# Patient Record
Sex: Female | Born: 1975 | Race: White | Hispanic: No | Marital: Married | Smoking: Former smoker
Health system: Southern US, Community
[De-identification: ages and names within clinical notes are randomized; demographics above are authoritative.]

## PROBLEM LIST (undated history)

## (undated) DIAGNOSIS — I10 Essential (primary) hypertension: Secondary | ICD-10-CM

## (undated) DIAGNOSIS — F419 Anxiety disorder, unspecified: Secondary | ICD-10-CM

## (undated) DIAGNOSIS — R519 Headache, unspecified: Secondary | ICD-10-CM

## (undated) DIAGNOSIS — F329 Major depressive disorder, single episode, unspecified: Secondary | ICD-10-CM

## (undated) DIAGNOSIS — F32A Depression, unspecified: Secondary | ICD-10-CM

## (undated) DIAGNOSIS — I639 Cerebral infarction, unspecified: Secondary | ICD-10-CM

## (undated) DIAGNOSIS — R51 Headache: Secondary | ICD-10-CM

## (undated) HISTORY — DX: Depression, unspecified: F32.A

## (undated) HISTORY — DX: Headache: R51

## (undated) HISTORY — PX: NO PAST SURGERIES: SHX2092

## (undated) HISTORY — DX: Headache, unspecified: R51.9

## (undated) HISTORY — DX: Cerebral infarction, unspecified: I63.9

## (undated) HISTORY — DX: Anxiety disorder, unspecified: F41.9

## (undated) HISTORY — DX: Major depressive disorder, single episode, unspecified: F32.9

## (undated) HISTORY — PX: ESSURE TUBAL LIGATION: SUR464

---

## 2013-03-14 LAB — HM PAP SMEAR

## 2013-06-11 ENCOUNTER — Ambulatory Visit: Payer: Self-pay | Admitting: Family Medicine

## 2013-06-25 ENCOUNTER — Encounter: Payer: Self-pay | Admitting: Family Medicine

## 2013-06-25 ENCOUNTER — Ambulatory Visit (INDEPENDENT_AMBULATORY_CARE_PROVIDER_SITE_OTHER): Payer: BC Managed Care – PPO | Admitting: Family Medicine

## 2013-06-25 ENCOUNTER — Encounter (INDEPENDENT_AMBULATORY_CARE_PROVIDER_SITE_OTHER): Payer: Self-pay

## 2013-06-25 VITALS — BP 116/81 | HR 69 | Resp 16 | Ht 66.5 in | Wt 249.0 lb

## 2013-06-25 DIAGNOSIS — E669 Obesity, unspecified: Secondary | ICD-10-CM

## 2013-06-25 DIAGNOSIS — I10 Essential (primary) hypertension: Secondary | ICD-10-CM

## 2013-06-25 MED ORDER — LISINOPRIL-HYDROCHLOROTHIAZIDE 10-12.5 MG PO TABS: 1.0000 | ORAL_TABLET | Freq: Every day | ORAL | Status: DC

## 2013-06-25 NOTE — Progress Notes (Signed)
Subjective:    Patient ID: Virginia Grant, female    DOB: [birth year], 38 y.o.   MRN: [id number]  HPI  Virginia Grant is here today to establish care with our practice.  She found our name online.  In the past, she has received her care at [facility]) whenever she got sick and has not had a PCP in a long time.  She has received GYN care at [facility] by Dr Arnette Schaumann.  She would like to discuss the condition listed below:   1)  Obesity - She has struggled with weight fluctuation for several years.  She has tried diet and exercise alone and can not seem to get and keep the weight off.  She wants to discuss appetitive suppressant options that will not interfere with her mood medications.    Review of Systems  Constitutional: Positive for unexpected weight change. Negative for activity change and fatigue.  HENT: Negative.   Eyes: Negative.   Respiratory: Negative for shortness of breath.   Cardiovascular: Negative for chest pain, palpitations and leg swelling.  Gastrointestinal: Negative for diarrhea and constipation.  Endocrine: Negative.   Genitourinary: Negative for difficulty urinating.  Musculoskeletal: Negative.   Skin: Negative.   Neurological: Negative.   Hematological: Negative for adenopathy. Does not bruise/bleed easily.  Psychiatric/Behavioral: Negative for sleep disturbance and dysphoric mood. The patient is not nervous/anxious.      Past Medical History  Diagnosis Date  . Depression   . Hyperlipidemia   . Anxiety      Past Surgical History  Procedure Laterality Date  . Essure tubal ligation       History   Social History Narrative   Marital Status:  Married Museum/gallery curator)    Children:  Son [name])   Pets: Dogs (3)    Living Situation: Lives with husband and son.   Occupation:  Counsellor Needs]  [employer]   Education:  Bachelor's Degree    Tobacco Use:  She smoked 1 ppd for about 5 years.  She quit in 1997.     Alcohol Use:  Rarely   Drug Use:  None   Diet:  Regular   Exercise:  Limited; She was exercising up until December but has gotten off track. She was walking/running and was going to the [facility].     Hobbies:  Family                  Family History  Problem Relation Age of Onset  . Hypertension Mother   . Diabetes Father   . Hypertension Father   . Cancer Maternal Aunt     Colon  . Cancer Maternal Uncle     Colon   . Heart attack Maternal Uncle   . COPD Maternal Grandmother   . Heart disease Maternal Grandmother      No Known Allergies   Immunization History  Administered Date(s) Administered  . Tdap 04/25/1998      Objective:   Physical Exam  Vitals reviewed. Constitutional: She is oriented to person, place, and time.  Eyes: Conjunctivae are normal. No scleral icterus.  Neck: Neck supple. No thyromegaly present.  Cardiovascular: Normal rate, regular rhythm and normal heart sounds.   Pulmonary/Chest: Effort normal and breath sounds normal.  Musculoskeletal: She exhibits no edema and no tenderness.  Lymphadenopathy:    She has no cervical adenopathy.  Neurological: She is alert and oriented to person, place, and time.  Skin: Skin is warm and dry.  Psychiatric: She has a normal mood and affect. Her behavior is normal. Judgment and thought content normal.      Assessment & Plan:    Virginia Grant was seen today for establish care.  Diagnoses and associated orders for this visit:  Essential hypertension, benign - lisinopril-hydrochlorothiazide (PRINZIDE,ZESTORETIC) 10-12.5 MG per tablet; Take 1 tablet by mouth daily.   TIME SPENT "FACE TO FACE" WITH PATIENT -  30 MINS

## 2013-06-25 NOTE — Patient Instructions (Signed)
1)  Labs - CBC, CMET, TSH and Lipid Panel  2)  Read "Pounds & Inches"    Exercise to Lose Weight Exercise and a healthy diet may help you lose weight. Your doctor may suggest specific exercises. EXERCISE IDEAS AND TIPS  Choose low-cost things you enjoy doing, such as walking, bicycling, or exercising to workout videos.  Take stairs instead of the elevator.  Walk during your lunch break.  Park your car further away from work or school.  Go to a gym or an exercise class.  Start with 5 to 10 minutes of exercise each day. Build up to 30 minutes of exercise 4 to 6 days a week.  Wear shoes with good support and comfortable clothes.  Stretch before and after working out.  Work out until you breathe harder and your heart beats faster.  Drink extra water when you exercise.  Do not do so much that you hurt yourself, feel dizzy, or get very short of breath. Exercises that burn about 150 calories:  Running 1  miles in 15 minutes.  Playing volleyball for 45 to 60 minutes.  Washing and waxing a car for 45 to 60 minutes.  Playing touch football for 45 minutes.  Walking 1  miles in 35 minutes.  Pushing a stroller 1  miles in 30 minutes.  Playing basketball for 30 minutes.  Raking leaves for 30 minutes.  Bicycling 5 miles in 30 minutes.  Walking 2 miles in 30 minutes.  Dancing for 30 minutes.  Shoveling snow for 15 minutes.  Swimming laps for 20 minutes.  Walking up stairs for 15 minutes.  Bicycling 4 miles in 15 minutes.  Gardening for 30 to 45 minutes.  Jumping rope for 15 minutes.  Washing windows or floors for 45 to 60 minutes. Document Released: 05/14/2010 Document Revised: 07/04/2011 Document Reviewed: 05/14/2010 [facility] Patient Information 2014 [place], [place].

## 2013-07-14 DIAGNOSIS — E669 Obesity, unspecified: Secondary | ICD-10-CM

## 2013-07-14 DIAGNOSIS — I10 Essential (primary) hypertension: Secondary | ICD-10-CM | POA: Insufficient documentation

## 2013-07-14 HISTORY — DX: Obesity, unspecified: E66.9

## 2013-07-15 ENCOUNTER — Encounter: Payer: Self-pay | Admitting: Family Medicine

## 2013-07-15 ENCOUNTER — Ambulatory Visit (INDEPENDENT_AMBULATORY_CARE_PROVIDER_SITE_OTHER): Payer: BC Managed Care – PPO | Admitting: Family Medicine

## 2013-07-15 VITALS — BP 126/82 | HR 78 | Resp 16 | Ht 66.5 in | Wt 245.0 lb

## 2013-07-15 DIAGNOSIS — Z23 Encounter for immunization: Secondary | ICD-10-CM

## 2013-07-15 DIAGNOSIS — Z0000 Encounter for general adult medical examination without abnormal findings: Secondary | ICD-10-CM

## 2013-07-15 DIAGNOSIS — R5381 Other malaise: Secondary | ICD-10-CM

## 2013-07-15 DIAGNOSIS — R5383 Other fatigue: Secondary | ICD-10-CM

## 2013-07-15 LAB — POCT URINALYSIS DIPSTICK
Bilirubin, UA: NEGATIVE
Blood, UA: NEGATIVE
Glucose, UA: NEGATIVE
Ketones, UA: NEGATIVE
Leukocytes, UA: NEGATIVE
Nitrite, UA: NEGATIVE
Protein, UA: NEGATIVE
Spec Grav, UA: <=1.005
Urobilinogen, UA: NEGATIVE
pH, UA: 6.5

## 2013-07-15 MED ORDER — CYANOCOBALAMIN 1000 MCG/ML IJ SOLN
1000.0000 ug | Freq: Once | INTRAMUSCULAR | Status: AC
Start: 2013-07-15 — End: 2013-07-15
  Administered 2013-07-15: 1000 ug via INTRAMUSCULAR

## 2013-07-15 NOTE — Progress Notes (Signed)
Subjective:    Patient ID: Virginia Grant, female    DOB: 1975/12/03, 38 y.o.   MRN: [id number]  HPI  Dois DavenportSandra is here today for her annual CPE.  She brought in her lab results to review.  Overall, she feels that her health is good.  Her only concern is her weight.  She has tried several weight loss programs with no success.  She would like to start HCG.    Review of Systems  Constitutional: Negative for activity change, appetite change, fatigue and unexpected weight change.  HENT: Negative for congestion, dental problem, ear pain, hearing loss, trouble swallowing and voice change.   Eyes: Negative for pain, redness and visual disturbance.  Respiratory: Negative for cough and shortness of breath.   Cardiovascular: Negative for chest pain, palpitations and leg swelling.  Gastrointestinal: Negative for nausea, vomiting, abdominal pain, diarrhea, constipation and blood in stool.  Endocrine: Negative for cold intolerance, heat intolerance, polydipsia, polyphagia and polyuria.  Genitourinary: Negative for dysuria, urgency, frequency, hematuria, vaginal discharge and pelvic pain.  Musculoskeletal: Negative for arthralgias, back pain, joint swelling, myalgias and neck pain.  Skin: Negative for rash.  Neurological: Negative for dizziness, weakness and headaches.  Hematological: Negative for adenopathy. Does not bruise/bleed easily.  Psychiatric/Behavioral: Negative for sleep disturbance, dysphoric mood and decreased concentration. The patient is not nervous/anxious.   All other systems reviewed and are negative.    Past Medical History  Diagnosis Date  . Depression   . Hyperlipidemia   . Anxiety      Past Surgical History  Procedure Laterality Date  . Essure tubal ligation       History   Social History Narrative   Marital Status:  Married Museum/gallery curator(Chris)    Children:  Son [name](Noah)   Pets: Dogs (3)    Living Situation: Lives with husband and son.   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  She quit in 1997.     Alcohol Use:  Rarely   Drug Use:  None   Diet:  Regular   Exercise:  Limited; She was exercising up until December but has gotten off track. She was walking/running and [facility].     Hobbies:  Family                  Family History  Problem Relation Age of Onset  . Hypertension Mother   . Diabetes Father   . Hypertension Father   . Cancer Maternal Aunt     Colon  . Cancer Maternal Uncle     Colon   . Heart attack Maternal Uncle   . COPD Maternal Grandmother   . Heart disease Maternal Grandmother      Current Outpatient Prescriptions on File Prior to Visit  Medication Sig Dispense Refill  . lisinopril-hydrochlorothiazide (PRINZIDE,ZESTORETIC) 10-12.5 MG per tablet Take 1 tablet by mouth daily.  30 tablet  1  . LORazepam (ATIVAN) 1 MG tablet Take 1 mg by mouth 2 (two) times daily.       Marland Kitchen. venlafaxine XR (EFFEXOR-XR) 75 MG 24 hr capsule Take 225 mg by mouth daily with breakfast.        No current facility-administered medications on file prior to visit.     No Known Allergies   Immunization History  Administered Date(s) Administered  . Tdap 04/25/1998, 07/15/2013  Objective:   Physical Exam  Nursing note and vitals reviewed. Constitutional: She is oriented to person, place, and time. She appears well-developed and well-nourished. No distress.  HENT:  Head: Normocephalic and atraumatic.  Right Ear: External ear normal.  Left Ear: External ear normal.  Nose: Nose normal.  Mouth/Throat: Oropharynx is clear and moist.  Eyes: Conjunctivae and EOM are normal. Pupils are equal, round, and reactive to light. Right eye exhibits no discharge. Left eye exhibits no discharge. No scleral icterus.  Neck: Normal range of motion. Neck supple. No thyromegaly present.  Cardiovascular: Normal rate, regular rhythm, normal heart  sounds and intact distal pulses.  Exam reveals no gallop and no friction rub.   No murmur heard. Pulmonary/Chest: Effort normal and breath sounds normal. Right breast exhibits no inverted nipple, no mass, no nipple discharge, no skin change and no tenderness. Left breast exhibits no inverted nipple, no mass, no nipple discharge, no skin change and no tenderness. Breasts are symmetrical.  Abdominal: Soft. Bowel sounds are normal. She exhibits no distension and no mass. There is no tenderness.  Musculoskeletal: Normal range of motion. She exhibits no edema and no tenderness.  Lymphadenopathy:    She has no cervical adenopathy.  Neurological: She is alert and oriented to person, place, and time. She has normal reflexes.  Skin: Skin is warm and dry. No rash noted.  Psychiatric: She has a normal mood and affect. Her behavior is normal. Judgment and thought content normal.      Assessment & Plan:    Mirna was seen today for annual exam.  Diagnoses and associated orders for this visit:  Routine general medical examination at a health care facility - POCT urinalysis dipstick - EKG 12-Lead  Other malaise and fatigue - cyanocobalamin ((VITAMIN B-12)) injection 1,000 mcg; Inject 1 mL (1,000 mcg total) into the muscle once.  Need for prophylactic vaccination with combined diphtheria-tetanus-pertussis (DTP) vaccine - Tdap vaccine greater than or equal to 7yo IM

## 2013-07-15 NOTE — Assessment & Plan Note (Signed)
She received her Boostrix without difficulty.  She was given a handout discussing possible side effects.  

## 2013-08-30 ENCOUNTER — Encounter: Payer: Self-pay | Admitting: Family Medicine

## 2013-08-30 ENCOUNTER — Ambulatory Visit (INDEPENDENT_AMBULATORY_CARE_PROVIDER_SITE_OTHER): Payer: BC Managed Care – PPO | Admitting: Family Medicine

## 2013-08-30 VITALS — BP 129/87 | HR 87 | Resp 16 | Ht 67.0 in | Wt 225.0 lb

## 2013-08-30 DIAGNOSIS — E669 Obesity, unspecified: Secondary | ICD-10-CM

## 2013-08-30 NOTE — Progress Notes (Signed)
Subjective:    Patient ID: Virginia AbrahamsSandra Grant, female    DOB: [birth year], 38 y.o.   MRN: [id number]  HPI  Virginia DavenportSandra is here for a follow up of her weight loss.  She has done well on the HCG program.  She has lost 20 lbs and 2.5 inches from around her abdomen.  She is very happy with her results.      Review of Systems  Constitutional: Negative for fatigue and unexpected weight change (patient dieting and exercising).  Cardiovascular: Negative for chest pain, palpitations and leg swelling.  Psychiatric/Behavioral: The patient is not nervous/anxious.   All other systems reviewed and are negative.   Past Medical History  Diagnosis Date  . Depression   . Hyperlipidemia   . Anxiety      Past Surgical History  Procedure Laterality Date  . Essure tubal ligation       History   Social History Narrative   Marital Status:  Married Museum/gallery curator(Chris)    Children:  Son [name](Noah)   Pets: Dogs (3)    Living Situation: Lives with husband and son.   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  She quit in 1997.     Alcohol Use:  Rarely   Drug Use:  None   Diet:  Regular   Exercise:  Limited; She was exercising up until December but has gotten off track. She was walking/running and was [facility].     Hobbies:  Family                  Family History  Problem Relation Age of Onset  . Hypertension Mother   . Diabetes Father   . Hypertension Father   . Cancer Maternal Aunt     Colon  . Cancer Maternal Uncle     Colon   . Heart attack Maternal Uncle   . COPD Maternal Grandmother   . Heart disease Maternal Grandmother      Current Outpatient Prescriptions on File Prior to Visit  Medication Sig Dispense Refill  . lisinopril-hydrochlorothiazide (PRINZIDE,ZESTORETIC) 10-12.5 MG per tablet Take 1 tablet by mouth daily.  30 tablet  1  . LORazepam (ATIVAN) 1 MG tablet Take 1 mg by  mouth 2 (two) times daily.       Marland Kitchen. venlafaxine XR (EFFEXOR-XR) 75 MG 24 hr capsule Take 225 mg by mouth daily with breakfast.        No current facility-administered medications on file prior to visit.     No Known Allergies   Immunization History  Administered Date(s) Administered  . Tdap 04/25/1998, 07/15/2013       Objective:   Physical Exam  Nursing note and vitals reviewed. Constitutional: She is oriented to person, place, and time. She appears well-nourished.  Eyes: Conjunctivae are normal. No scleral icterus.  Neck: Neck supple. No thyromegaly present.  Cardiovascular: Normal rate, regular rhythm and normal heart sounds.   Pulmonary/Chest: Effort normal and breath sounds normal.  Musculoskeletal: She exhibits no edema and no tenderness.  Lymphadenopathy:    She has no cervical adenopathy.  Neurological: She is alert and oriented to person, place, and time.  Skin: Skin is warm and dry.  Psychiatric: She has a normal mood and affect. Her behavior is normal. Judgment and thought content normal.      Assessment & Plan:    Virginia DavenportSandra  was seen today for weight check.  Diagnoses and associated orders for this visit:  Obesity, unspecified

## 2016-04-08 DIAGNOSIS — R1031 Right lower quadrant pain: Secondary | ICD-10-CM | POA: Insufficient documentation

## 2016-12-24 DIAGNOSIS — I639 Cerebral infarction, unspecified: Secondary | ICD-10-CM

## 2016-12-24 HISTORY — DX: Cerebral infarction, unspecified: I63.9

## 2017-01-09 ENCOUNTER — Other Ambulatory Visit: Payer: Self-pay

## 2017-01-09 ENCOUNTER — Observation Stay (HOSPITAL_COMMUNITY): Payer: BC Managed Care – PPO

## 2017-01-09 ENCOUNTER — Encounter: Payer: Self-pay | Admitting: Family

## 2017-01-09 ENCOUNTER — Encounter (HOSPITAL_BASED_OUTPATIENT_CLINIC_OR_DEPARTMENT_OTHER): Payer: Self-pay

## 2017-01-09 ENCOUNTER — Inpatient Hospital Stay (HOSPITAL_BASED_OUTPATIENT_CLINIC_OR_DEPARTMENT_OTHER)
Admission: EM | Admit: 2017-01-09 | Discharge: 2017-01-11 | DRG: 065 | Disposition: A | Discharge status: Home / Self Care | Payer: BC Managed Care – PPO | Attending: Family Medicine | Admitting: Family Medicine

## 2017-01-09 ENCOUNTER — Ambulatory Visit (HOSPITAL_BASED_OUTPATIENT_CLINIC_OR_DEPARTMENT_OTHER)
Admission: RE | Admit: 2017-01-09 | Discharge: 2017-01-09 | Disposition: A | Discharge status: Home / Self Care | Payer: BC Managed Care – PPO | Source: Ambulatory Visit | Attending: Family | Admitting: Family

## 2017-01-09 ENCOUNTER — Ambulatory Visit (INDEPENDENT_AMBULATORY_CARE_PROVIDER_SITE_OTHER): Payer: BC Managed Care – PPO | Admitting: Family

## 2017-01-09 ENCOUNTER — Telehealth: Payer: Self-pay | Admitting: Family Medicine

## 2017-01-09 VITALS — BP 123/63 | HR 64 | Temp 98.6°F | Resp 16 | Ht 66.5 in | Wt 216.0 lb

## 2017-01-09 DIAGNOSIS — F329 Major depressive disorder, single episode, unspecified: Secondary | ICD-10-CM | POA: Insufficient documentation

## 2017-01-09 DIAGNOSIS — F418 Other specified anxiety disorders: Secondary | ICD-10-CM | POA: Diagnosis not present

## 2017-01-09 DIAGNOSIS — R471 Dysarthria and anarthria: Secondary | ICD-10-CM | POA: Diagnosis present

## 2017-01-09 DIAGNOSIS — Z6833 Body mass index (BMI) 33.0-33.9, adult: Secondary | ICD-10-CM

## 2017-01-09 DIAGNOSIS — R531 Weakness: Secondary | ICD-10-CM

## 2017-01-09 DIAGNOSIS — I639 Cerebral infarction, unspecified: Principal | ICD-10-CM | POA: Diagnosis present

## 2017-01-09 DIAGNOSIS — R29701 NIHSS score 1: Secondary | ICD-10-CM | POA: Diagnosis present

## 2017-01-09 DIAGNOSIS — F411 Generalized anxiety disorder: Secondary | ICD-10-CM | POA: Insufficient documentation

## 2017-01-09 DIAGNOSIS — R4781 Slurred speech: Secondary | ICD-10-CM | POA: Diagnosis present

## 2017-01-09 DIAGNOSIS — I6330 Cerebral infarction due to thrombosis of unspecified cerebral artery: Secondary | ICD-10-CM

## 2017-01-09 DIAGNOSIS — Z823 Family history of stroke: Secondary | ICD-10-CM

## 2017-01-09 DIAGNOSIS — Z87891 Personal history of nicotine dependence: Secondary | ICD-10-CM

## 2017-01-09 DIAGNOSIS — R2990 Unspecified symptoms and signs involving the nervous system: Secondary | ICD-10-CM | POA: Insufficient documentation

## 2017-01-09 DIAGNOSIS — R29818 Other symptoms and signs involving the nervous system: Secondary | ICD-10-CM

## 2017-01-09 DIAGNOSIS — E669 Obesity, unspecified: Secondary | ICD-10-CM | POA: Diagnosis present

## 2017-01-09 DIAGNOSIS — R29810 Facial weakness: Secondary | ICD-10-CM | POA: Diagnosis present

## 2017-01-09 DIAGNOSIS — I10 Essential (primary) hypertension: Secondary | ICD-10-CM | POA: Diagnosis not present

## 2017-01-09 DIAGNOSIS — Z800 Family history of malignant neoplasm of digestive organs: Secondary | ICD-10-CM

## 2017-01-09 DIAGNOSIS — Z8249 Family history of ischemic heart disease and other diseases of the circulatory system: Secondary | ICD-10-CM

## 2017-01-09 DIAGNOSIS — G8194 Hemiplegia, unspecified affecting left nondominant side: Secondary | ICD-10-CM | POA: Diagnosis present

## 2017-01-09 HISTORY — DX: Essential (primary) hypertension: I10

## 2017-01-09 LAB — DIFFERENTIAL
BASOS ABS: 0 10*3/uL (ref 0.0–0.1)
BASOS PCT: 0 %
EOS ABS: 0.2 10*3/uL (ref 0.0–0.7)
Eosinophils Relative: 3 %
LYMPHS ABS: 1.5 10*3/uL (ref 0.7–4.0)
Lymphocytes Relative: 21 %
MONO ABS: 0.4 10*3/uL (ref 0.1–1.0)
MONOS PCT: 5 %
NEUTROS ABS: 5 10*3/uL (ref 1.7–7.7)
Neutrophils Relative %: 71 %

## 2017-01-09 LAB — BASIC METABOLIC PANEL
Anion gap: 8 (ref 5–15)
BUN: 14 mg/dL (ref 6–20)
CALCIUM: 8.8 mg/dL — AB (ref 8.9–10.3)
CO2: 23 mmol/L (ref 22–32)
Chloride: 107 mmol/L (ref 101–111)
Creatinine, Ser: 0.85 mg/dL (ref 0.44–1.00)
GFR calc Af Amer: >60 mL/min (ref >60)
GLUCOSE: 111 mg/dL — AB (ref 65–99)
Potassium: 3.7 mmol/L (ref 3.5–5.1)
Sodium: 138 mmol/L (ref 135–145)

## 2017-01-09 LAB — PROTIME-INR
INR: 1
PROTHROMBIN TIME: 13.1 s (ref 11.4–15.2)

## 2017-01-09 LAB — APTT: aPTT: 27 seconds (ref 24–36)

## 2017-01-09 LAB — CBC
HCT: 38.6 % (ref 36.0–46.0)
HEMOGLOBIN: 13.3 g/dL (ref 12.0–15.0)
MCH: 32.1 pg (ref 26.0–34.0)
MCHC: 34.5 g/dL (ref 30.0–36.0)
MCV: 93.2 fL (ref 78.0–100.0)
Platelets: 251 10*3/uL (ref 150–400)
RBC: 4.14 MIL/uL (ref 3.87–5.11)
RDW: 13 % (ref 11.5–15.5)
WBC: 7.1 10*3/uL (ref 4.0–10.5)

## 2017-01-09 MED ORDER — ACETAMINOPHEN 160 MG/5ML PO SOLN: 650.0000 mg | ORAL | Status: DC | PRN

## 2017-01-09 MED ORDER — HYDRALAZINE HCL 20 MG/ML IJ SOLN: 5.0000 mg | Freq: Three times a day (TID) | INTRAMUSCULAR | Status: DC | PRN

## 2017-01-09 MED ORDER — STROKE: EARLY STAGES OF RECOVERY BOOK
Freq: Once | Status: AC
  Administered 2017-01-09: 18:00:00
  Filled 2017-01-09: qty 1

## 2017-01-09 MED ORDER — ACETAMINOPHEN 325 MG PO TABS: 650.0000 mg | ORAL_TABLET | ORAL | Status: DC | PRN

## 2017-01-09 MED ORDER — SODIUM CHLORIDE 0.9 % IV SOLN
INTRAVENOUS | Status: DC
  Administered 2017-01-09: 18:00:00 via INTRAVENOUS

## 2017-01-09 MED ORDER — VENLAFAXINE HCL ER 75 MG PO CP24: 225.0000 mg | ORAL_CAPSULE | Freq: Every day | ORAL | Status: DC

## 2017-01-09 MED ORDER — IOPAMIDOL (ISOVUE-370) INJECTION 76%
INTRAVENOUS | Status: AC
  Administered 2017-01-09: 50 mL
  Filled 2017-01-09: qty 50

## 2017-01-09 MED ORDER — SENNOSIDES-DOCUSATE SODIUM 8.6-50 MG PO TABS: 1.0000 | ORAL_TABLET | Freq: Every evening | ORAL | Status: DC | PRN

## 2017-01-09 MED ORDER — HEPARIN SODIUM (PORCINE) 5000 UNIT/ML IJ SOLN
5000.0000 [IU] | Freq: Three times a day (TID) | INTRAMUSCULAR | Status: DC
  Administered 2017-01-09 – 2017-01-11 (×7): 5000 [IU] via SUBCUTANEOUS
  Filled 2017-01-09 (×7): qty 1

## 2017-01-09 MED ORDER — ACETAMINOPHEN 650 MG RE SUPP: 650.0000 mg | RECTAL | Status: DC | PRN

## 2017-01-09 NOTE — Consult Note (Signed)
Neurology Consultation  Reason for Consult: abnormal imaging findings on CT Referring Physician: Dr. Konrad Dolores  CC: left facial droop, slurred speech, left-sided numbness and weakness  History is obtained from:patient  HPI: Virginia Grant is a 41 y.o. female Past medical history of anxiety and depression, who was in her usual state of health until Saturday, when she noticed some left lower facial drooping, and numbness in her left arm and leg. She attributed her symptoms to possibly having consumed a couple of alcoholic drinks and/or may be having Bell's palsy. She was out of town and was driving back with her husband. She came to the hospital today because she noticed that she is having difficulty lifting her left leg while negotiating the stairs. noncontrast CT of the head was done at [facility], that showed right internal capsular hypodensity.  She denies any prior similar episodes. Denies any episodes of blurred vision. She had headaches that were migrainous in nature till her late 43s. She is not on hormonal contraception. She has no history of miscarriages. No history of clots in the lungs or legs. She has 1 child. Her mother has rheumatoid arthritis. Her sister had one stillbirth and 1 miscarriage due to some clotting disorder for which she is on aspirin.  Patient denies any neck pain, whiplash-like injury to the neck, chiropractic manipulation. She denies any episodes of monocular visual loss or blurred vision, denies shocklike sensation down the spine while bending her neck or tingling or numbness while she has increased core body temperature such as during hot shower or while in a hot tub. No family history of multiple sclerosis.   LKW: Saturday 9 PM 01/07/2017 tpa given?: no, outside window Premorbid modified Rankin scale (mRS):0 ROS: A 14 point ROS was performed and is negative except as noted in the HPI.   Past Medical History:  Diagnosis Date  . Anxiety   .  Depression     Family History  Problem Relation Age of Onset  . Hypertension Mother   . Diabetes Father   . Hypertension Father   . Cancer Maternal Aunt        Colon  . Cancer Maternal Uncle        Colon   . Heart attack Maternal Uncle   . COPD Maternal Grandmother   . Heart disease Maternal Grandmother   . Hypertension Brother     Social History:   reports that she quit smoking about 21 years ago. Her smoking use included Cigarettes. She has a 5.00 pack-year smoking history. She has never used smokeless tobacco. She reports that she drinks alcohol. She reports that she does not use drugs.   Medications No current facility-administered medications for this encounter.   Home medications-Effexor XR  Exam: Current vital signs: BP (!) 142/77 (BP Location: Left Arm)   Pulse 82   Temp 98.4 F (36.9 C)   Resp 20   Ht  (1.702 m)   Wt 96.3 kg (212 lb 4.9 oz)   LMP 01/09/2017   SpO2 100%   BMI 33.25 kg/m  Vital signs in last 24 hours: Temp:  [98.4 F (36.9 C)-99.3 F (37.4 C)] 98.4 F (36.9 C) (09/17 1345) Pulse Rate:  [64-82] 82 (09/17 1203) Resp:  [16-20] 20 (09/17 1203) BP: (123-142)/(63-77) 142/77 (09/17 1203) SpO2:  [98 %-100 %] 100 % (09/17 1203) Weight:  [96.3 kg (212 lb 4.9 oz)-98 kg (216 lb)] 96.3 kg (212 lb 4.9 oz) (09/17 1202)  GENERAL: Awake, alert in  NAD HEENT: - Normocephalic and atraumatic, dry mm, no LN++, no Thyromegally LUNGS - Clear to auscultation bilaterally with no wheezes CV - S1S2 RRR, no m/r/g, equal pulses bilaterally. ABDOMEN - Soft, nontender, nondistended with normoactive BS Ext: warm, well perfused, intact peripheral pulses,  No edema  NEURO:  Mental Status: AA&Ox3  Language: speech is clear.  Naming, repetition, fluency, and comprehension intact. Cranial Nerves: PERRL 68mm/brisk. Funduscopy shows sharp disc margins,EOMI, visual fields full, left facial droop on the lower face, facial sensation intact, hearing intact,  tongue/uvula/soft palate midline, normal sternocleidomastoid and trapezius muscle strength. No evidence of tongue atrophy or fibrillations Motor: 5/5 right upper and right lower extremity. 5/5 left lower extremity. 4+/5 right lower extremity with no vertical drift. Tone: is normal and bulk is normal Sensation- Intact to light touch bilaterally Coordination: FTN intact bilaterally, no ataxia in BLE. Gait- deferred Deep tendon reflexes-brisk all over with downgoing toes  NIHSS 1a Level of Conscious.: 0 1b LOC Questions: 0 1c LOC Commands: 0 2 Best Gaze: 0 3 Visual: 0 4 Facial Palsy: 1 5a Motor Arm - left: 0 5b Motor Arm - Right: 0 6a Motor Leg - Left: 0 6b Motor Leg - Right: 0 7 Limb Ataxia: 0 8 Sensory: 0 9 Best Language: 0 10 Dysarthria: 0 11 Extinct. and Inatten.: 0 TOTAL: 1  Labs I have reviewed labs in epic and the results pertinent to this consultation are:  CBC    Component Value Date/Time   WBC 7.1 01/09/2017 1228   RBC 4.14 01/09/2017 1228   HGB 13.3 01/09/2017 1228   HCT 38.6 01/09/2017 1228   PLT 251 01/09/2017 1228   MCV 93.2 01/09/2017 1228   MCH 32.1 01/09/2017 1228   MCHC 34.5 01/09/2017 1228   RDW 13.0 01/09/2017 1228   LYMPHSABS 1.5 01/09/2017 1228   MONOABS 0.4 01/09/2017 1228   EOSABS 0.2 01/09/2017 1228   BASOSABS 0.0 01/09/2017 1228    CMP     Component Value Date/Time   NA 138 01/09/2017 1228   K 3.7 01/09/2017 1228   CL 107 01/09/2017 1228   CO2 23 01/09/2017 1228   GLUCOSE 111 (H) 01/09/2017 1228   BUN 14 01/09/2017 1228   CREATININE 0.85 01/09/2017 1228   CALCIUM 8.8 (L) 01/09/2017 1228   GFRNONAA >60 01/09/2017 1228   GFRAA >60 01/09/2017 1228    Lipid Panel  No results found for: CHOL, TRIG, HDL, CHOLHDL, VLDL, LDLCALC, LDLDIRECT   Imaging I have reviewed the images obtained:  CT-scan of the brain shows nonspecific looking hypodensity in the right insular and right internal capsule.  MRI examination of the brain -  pending  Assessment:  41 year old with a history of anxiety and depression presents for evaluation of acute onset left facial droop, dysarthria and left-sided weakness. On my examination, I was able to elicit mild left hemiparesis in the left upper extremity as well as left facial droop. CT scan imaging of the head shows a possible right cerebral hemisphere hypodensity, in the insular/internal capsular area.  Differentials to consider this time: -Stroke -Demyelinating disease -Mass  Recommendations: - MRI brain with and without contrast - I would also like to obtain a CTA of the head and neck - A1c, Lipid panel - ASA 325 for now. - Tele - 2D echo - Further recs after imaging results.  -- Milon Dikes, MD [facility] [phone]  If 7pm to 7am, please call on call as listed on AMION.

## 2017-01-09 NOTE — Telephone Encounter (Signed)
Noted. Patient has an appointment scheduled with Sandford Craze, NP for today at 9:45 AM. Thanks.

## 2017-01-09 NOTE — ED Notes (Signed)
ED Provider at bedside evaluating the patient - RN at bedside to assist

## 2017-01-09 NOTE — Patient Instructions (Signed)
Please proceed to the first floor imaging for a stat CT head.

## 2017-01-09 NOTE — Progress Notes (Signed)
Patient arrived to unit.  Alert, verbal. No noted distress or complaints of discomfort.  Resting in bed with call light in reach.

## 2017-01-09 NOTE — Progress Notes (Signed)
Subjective:    Patient ID: Virginia Grant, female    DOB: 01/03/1976, 41 y.o.   MRN: [id number]  HPI  She reports that Saturday evening she developed slurred speech.  LUE felt "like slow motion.  This only lasted 30 minutes then improved." Reports that the following day the "symptoms were not as strong."  She reports that today her left foot feels week. Having trouble lifting it up all the way. She reports some blurred vision in the left eye. She reports some numbness in her cheek, left ar and in the back of her left leg.  She denies hx of hyperlipidemia-  Though this is indicated on her chart.  She reports that she has depression/anxiety- sees. [name] and reports that this is well treated.     Review of Systems  Constitutional: Negative for unexpected weight change.  HENT: Negative for hearing loss, rhinorrhea and trouble swallowing.   Eyes: Positive for visual disturbance.  Respiratory: Negative for cough.   Cardiovascular: Negative for leg swelling.  Gastrointestinal: Negative for constipation and diarrhea.  Genitourinary: Negative for dysuria, frequency and menstrual problem.  Musculoskeletal: Negative for arthralgias and myalgias.  Neurological: Negative for headaches.  Hematological: Negative for adenopathy.  Psychiatric/Behavioral:       See hpi   Past Medical History:  Diagnosis Date  . Anxiety   . Depression   . Hyperlipidemia      Social History   Social History  . Marital status: Married    Spouse name: [name]   . Number of children: 1  . Years of education: 33   Occupational History  .  [employer]   Social History Main Topics  . Smoking status: Former Smoker    Packs/day: 1.00    Years: 5.00    Types: Cigarettes    Quit date: 04/26/1995  . Smokeless tobacco: Never Used  . Alcohol use Yes     Comment: Rarely  . Drug use: No  . Sexual activity: Yes    Partners: Male   Other Topics Concern  . Not on file   Social History Narrative   Marital Status:  Married [name])    Children:  Son [name]) born 2000   Pets: Dogs 2   Living Situation: Lives with husband and son.   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  She quit in 1997.     Alcohol Use:  Rarely   Drug Use:  None   Diet:  Regular   Exercise:  Limited; She was exercising up until December but has gotten off track. She was walking/running and was going to the [facility].     Hobbies:  Family                 Past Surgical History:  Procedure Laterality Date  . ESSURE TUBAL LIGATION      Family History  Problem Relation Age of Onset  . Hypertension Mother   . Diabetes Father   . Hypertension Father   . Cancer Maternal Aunt        Colon  . Cancer Maternal Uncle        Colon   . Heart attack Maternal Uncle   . COPD Maternal Grandmother   . Heart disease Maternal Grandmother   . Hypertension Brother     No Known Allergies  Current Outpatient Prescriptions on File Prior  to Visit  Medication Sig Dispense Refill  . venlafaxine XR (EFFEXOR-XR) 75 MG 24 hr capsule Take 225 mg by mouth daily with breakfast.      No current facility-administered medications on file prior to visit.     BP 123/63 (BP Location: Right Arm, Cuff Size: Large)   Pulse 64   Temp 98.6 F (37 C) (Oral)   Resp 16   Ht 5' 6.5" (1.689 m)   Wt 216 lb (98 kg)   LMP 01/09/2017   SpO2 98%   BMI 34.34 kg/m       Objective:   Physical Exam  Constitutional: She is oriented to person, place, and time. She appears well-developed and well-nourished.  HENT:  Head: Normocephalic and atraumatic.  Cardiovascular: Normal rate, regular rhythm and normal heart sounds.   No murmur heard. Pulmonary/Chest: Effort normal and breath sounds normal. No respiratory distress. She has no wheezes.  Neurological: She is alert and oriented to person, place, and time.  + left facial droop, tongue is  midline.   No significant slurred speech. Left shoulder shrug mildly diminished.  EOM intact Able to raise left eyebrow LUE/LLE strength is mildly diminished.  Dragging left foot slightly when walking   Skin: Skin is warm and dry.  Psychiatric: She has a normal mood and affect. Her behavior is normal. Judgment and thought content normal.          Assessment & Plan:  CVA- New.  Presentation concerning for CVA. Case reviewed with Dr. Abner Greenspan. Pt was sent for stat CT head. CT head notes: recent infarct or other asymmetric abnormality in the anterior limb right internal capsule. MRI recommended. Pt was unaccompanied today.  Pt was brought by this provider directly to the [facility] ED from the imaging department for further evaluation.  Report was given to ER physician on duty- Dr. Rhunette Croft.

## 2017-01-09 NOTE — Telephone Encounter (Signed)
Patient Name: Virginia Grant  DOB: Jul 30, 1975    Initial Comment [name] states she's having slurred speech, side of her face is numbed on left side, left arm is numb, left foot feels numb.   Nurse Assessment  Nurse: Stefano Gaul, RN, Dwana Curd Date/Time (Eastern Time): 01/09/2017 8:16:55 AM  Confirm and document reason for call. If symptomatic, describe symptoms. ---[name] states parts of her left arm, left foot and left side of her face feel numb. Speech is slurred. Symptoms started Saturday night.  Does the patient have any new or worsening symptoms? ---Yes  Will a triage be completed? ---Yes  Related visit to physician within the last 2 weeks? ---No  Does the PT have any chronic conditions? (i.e. diabetes, asthma, etc.) ---No  Is the patient pregnant or possibly pregnant? (Ask all females between the ages of 28-55) ---No  Is this a behavioral health or substance abuse call? ---No     Guidelines    Guideline Title Affirmed Question Affirmed Notes  Neurologic Deficit [1] Numbness (i.e., loss of sensation) of the face, arm / hand, or leg / foot on one side of the body AND [2] gradual onset (e.g., days to weeks) AND [3] present now    Final Disposition User   See Physician within 4 Hours (or PCP triage) Stefano Gaul, RN, Vera    Comments  pt scheduled for 01/09/2017 at 9:45 am with Sandford Craze.   Referrals  REFERRED TO PCP OFFICE   Disagree/Comply: Comply

## 2017-01-09 NOTE — ED Triage Notes (Signed)
Pt states she had slurred speech, facial droop-sx started 9/15-states she had a CT scan PTA after PCP visit- CT showed a stroke per pt-steady gait to triage

## 2017-01-09 NOTE — H&P (Signed)
History and Physical    Virginia Grant ZOX:[id number] DOB: 13-Feb-1976 DOA: 01/09/2017   PCP: Sandford Craze, NP   Patient coming from:  Home    Chief Complaint: Left facial weakness and L sided weakness and numbness   HPI: Virginia Grant is a 41 y.o. female with medical history significant for anxiety and depression, brought from [facility] for further workup regarding strokelike symptoms. In review, the patient was in her usual state of health until about Saturday, when she noted some drooping in the left lower side of her face, without any sensory deficits. The patient thought at the time to have Bell's palsy, for which she did not seek medical attention. This morning, the patient noted slurred speech, with an resolving left facial droop, left arm numbness and tingling, and mild left lower extremity weakness. Patient  never had a similar episode. Denies any history of TIA. Denies vertigo dizziness or vision changes. Denies headaches  No confusion or seizures. Denies any chest pain, or shortness of breath. Denies any fever or chills, or night sweats. She denies any neck pain.Denies urine retention or incontinence.   No tobacco abuse. No new meds or hormonal supplements. Does not take a regular ASA a day, ot other antiplatelets or anticoagulants.Denies any recent long distance trips or recent surgeries. No sick contacts. Denies diabetes or multiple sclerosis history. Family history of stroke in 2 aunts.1 sister with history of multiple miscarriages. Patient was not administered TPA as is beyond time window for treatment consideration. Will admit for further evaluation and treatment.  ED Course:  BP (!) 142/77 (BP Location: Left Arm)   Pulse 82   Temp 98.4 F (36.9 C)   Resp 20   Ht  (1.702 m)   Wt 96.3 kg (212 lb 4.9 oz)   LMP 01/09/2017   SpO2 100%   BMI 33.25 kg/m    CT head recent infarct or other asymmetric abnormality in the anterior limb right internal capsule. CBC  and CMET normal  EKG SR  2 D echo pending  Review of Systems:  As per HPI otherwise all other systems reviewed and are negative  Past Medical History:  Diagnosis Date  . Anxiety   . Depression   . Hypertension     Past Surgical History:  Procedure Laterality Date  . ESSURE TUBAL LIGATION    . NO PAST SURGERIES      Social History Social History   Social History  . Marital status: Married    Spouse name: [name]   . Number of children: 1  . Years of education: 53   Occupational History  .  [employer]   Social History Main Topics  . Smoking status: Former Smoker    Packs/day: 1.00    Years: 5.00    Types: Cigarettes    Quit date: 04/26/1995  . Smokeless tobacco: Never Used  . Alcohol use Yes     Comment: Rarely  . Drug use: No  . Sexual activity: Yes    Partners: Male   Other Topics Concern  . Not on file   Social History Narrative   Marital Status:  Married [name])    Children:  Son [name]) born 2000   Pets: Dogs 2   Living Situation: Lives with husband and son.   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  She quit  in 1997.     Alcohol Use:  Rarely   Drug Use:  None   Diet:  Regular   Exercise:  Limited; She was exercising up until December but has gotten off track. She was walking/running and was going to the [facility].     Hobbies:  Family                  No Known Allergies  Family History  Problem Relation Age of Onset  . Hypertension Mother   . Diabetes Father   . Hypertension Father   . Cancer Maternal Aunt        Colon  . Cancer Maternal Uncle        Colon   . Heart attack Maternal Uncle   . COPD Maternal Grandmother   . Heart disease Maternal Grandmother   . Hypertension Brother       Prior to Admission medications   Medication Sig Start Date End Date Taking? Authorizing Provider  venlafaxine XR (EFFEXOR-XR) 75 MG 24 hr capsule  Take 225 mg by mouth daily with breakfast.  06/23/13 01/09/17  [provider]    Physical Exam:  Vitals:   01/09/17 1202 01/09/17 1203 01/09/17 1345  BP:  (!) 142/77   Pulse:  82   Resp:  20   Temp:  99.3 F (37.4 C) 98.4 F (36.9 C)  TempSrc:  Oral   SpO2:  100%   Weight: 96.3 kg (212 lb 4.9 oz)    Height:  (1.702 m)     Constitutional: NAD, calm, comfortable   Eyes: PERRL, lids and conjunctivae normal ENMT: Mucous membranes are moist, without exudate or lesions . Left facial droop  Neck: normal, supple, no masses, no thyromegaly Respiratory: clear to auscultation bilaterally, no wheezing, no crackles. Normal respiratory effort  Cardiovascular: Regular rate and rhythm,  murmur, rubs or gallops. No extremity edema. 2+ pedal pulses. No carotid bruits.  Abdomen: Soft, non tender, No hepatosplenomegaly. Bowel sounds positive.  Musculoskeletal: no clubbing / cyanosis. Moves all extremities Skin: no jaundice, No lesions.  Neurologic: Sensation intact  Strength 5/5 except on LUE slight, minimal decrease in motor ability  extremities, DTR normal L facial droop  Psychiatric:   Alert and oriented x 3. Normal mood.     Labs on Admission: I have personally reviewed following labs and imaging studies  CBC:  Recent Labs Lab 01/09/17 1228  WBC 7.1  NEUTROABS 5.0  HGB 13.3  HCT 38.6  MCV 93.2  PLT 251    Basic Metabolic Panel:  Recent Labs Lab 01/09/17 1228  NA 138  K 3.7  CL 107  CO2 23  GLUCOSE 111*  BUN 14  CREATININE 0.85  CALCIUM 8.8*    GFR: Estimated Creatinine Clearance: 101.7 mL/min (by C-G formula based on SCr of 0.85 mg/dL).  Liver Function Tests: No results for input(s): AST, ALT, ALKPHOS, BILITOT, PROT, ALBUMIN in the last 168 hours. No results for input(s): LIPASE, AMYLASE in the last 168 hours. No results for input(s): AMMONIA in the last 168 hours.  Coagulation Profile:  Recent Labs Lab 01/09/17 1228  INR 1.00    Cardiac  Enzymes: No results for input(s): CKTOTAL, CKMB, CKMBINDEX, TROPONINI in the last 168 hours.  BNP (last 3 results) No results for input(s): PROBNP in the last 8760 hours.  HbA1C: No results for input(s): HGBA1C in the last 72 hours.  CBG: No results for input(s): GLUCAP in the last 168 hours.  Lipid Profile: No results  for input(s): CHOL, HDL, LDLCALC, TRIG, CHOLHDL, LDLDIRECT in the last 72 hours.  Thyroid Function Tests: No results for input(s): TSH, T4TOTAL, FREET4, T3FREE, THYROIDAB in the last 72 hours.  Anemia Panel: No results for input(s): VITAMINB12, FOLATE, FERRITIN, TIBC, IRON, RETICCTPCT in the last 72 hours.  Urine analysis:    Component Value Date/Time   BILIRUBINUR negative 07/15/2013 1038   PROTEINUR negative 07/15/2013 1038   UROBILINOGEN negative 07/15/2013 1038   NITRITE negative 07/15/2013 1038   LEUKOCYTESUR Negative 07/15/2013 1038    Sepsis Labs: (procalcitonin:4,lacticidven:4) )No results found for this or any previous visit (from the past 240 hour(s)).   Radiological Exams on Admission: Ct Head Wo Contrast  Result Date: 01/09/2017 CLINICAL DATA:  Left-sided facial droop and weakness since 01/07/2017. Slurred speech with left arm and leg numbness. EXAM: CT HEAD WITHOUT CONTRAST TECHNIQUE: Contiguous axial images were obtained from the base of the skull through the vertex without intravenous contrast. COMPARISON:  None. FINDINGS: Brain: There is low-density in the right internal capsule anterior limb, asymmetric and primarily suspicious for recent small-vessel infarct. Asymmetric periventricular white matter low density extending superiorly from the frontal horn right lateral ventricle, without mass effect. No hemorrhage or hydrocephalus. Vascular: No hyperdense vessel or unexpected calcification. Skull: Negative Sinuses/Orbits: Negative Other: These results will be called to the ordering clinician or representative by the Radiologist Assistant,  and communication documented in the PACS or zVision Dashboard. IMPRESSION: Recent infarct or other asymmetric abnormality in the anterior limb right internal capsule. Recommend MRI characterization. Electronically Signed   By: Marnee Spring M.D.   On: 01/09/2017 11:38    EKG: Independently reviewed.  Assessment/Plan Active Problems:   Stroke (cerebrum) (HCC)   Essential hypertension, benign   Obesity   Anxiety state   Depression   Acute L facial weakness with left sided numbness and weakness on presentation,   Not a TPA candidate as last known normal was 3 days  prior to hospitalization. CT head shows  CT head recent infarct or other asymmetric abnormality in the anterior limb right internal capsule. No history of stroke, cancer or MS . CBC and CMET normal. UDS pending EKG SR  2 D echo pending Risk factors for CVA include fam Hx, obesity HTN not on meds. Patient not on ASA .    Admit to Tele /Obs  Stroke order set  Discussed with Dr. Wilford Corner, who is to order special imaging testing, unclear if this is stroke vs. Tumor vs other disease  Allow permissive HTN 2 D Echo   lipid panel A1C Aspirin  Appreciate Neuro involvement   Hypertension BP 142/77  Pulse 82   Allow permissive HTN Add Hydralazine Q6 hours as needed for BP 210/110   Depression Continue home  Effexor   DVT prophylaxis:   Code Status:     Family Communication:  Discussed with patient Disposition Plan: Expect patient to be discharged to home after condition improves Consults called:     Admission status:    Marcos Eke, PA-C [facility]   01/09/2017, 5:09 PM

## 2017-01-10 ENCOUNTER — Observation Stay (HOSPITAL_COMMUNITY): Payer: BC Managed Care – PPO

## 2017-01-10 ENCOUNTER — Observation Stay (HOSPITAL_BASED_OUTPATIENT_CLINIC_OR_DEPARTMENT_OTHER): Payer: BC Managed Care – PPO

## 2017-01-10 DIAGNOSIS — Z8249 Family history of ischemic heart disease and other diseases of the circulatory system: Secondary | ICD-10-CM | POA: Diagnosis not present

## 2017-01-10 DIAGNOSIS — R471 Dysarthria and anarthria: Secondary | ICD-10-CM | POA: Diagnosis present

## 2017-01-10 DIAGNOSIS — Z823 Family history of stroke: Secondary | ICD-10-CM | POA: Diagnosis not present

## 2017-01-10 DIAGNOSIS — I639 Cerebral infarction, unspecified: Secondary | ICD-10-CM | POA: Diagnosis present

## 2017-01-10 DIAGNOSIS — F418 Other specified anxiety disorders: Secondary | ICD-10-CM | POA: Diagnosis present

## 2017-01-10 DIAGNOSIS — R4781 Slurred speech: Secondary | ICD-10-CM | POA: Diagnosis present

## 2017-01-10 DIAGNOSIS — Z6833 Body mass index (BMI) 33.0-33.9, adult: Secondary | ICD-10-CM | POA: Diagnosis not present

## 2017-01-10 DIAGNOSIS — I6789 Other cerebrovascular disease: Secondary | ICD-10-CM

## 2017-01-10 DIAGNOSIS — R29810 Facial weakness: Secondary | ICD-10-CM | POA: Diagnosis present

## 2017-01-10 DIAGNOSIS — R29701 NIHSS score 1: Secondary | ICD-10-CM | POA: Diagnosis present

## 2017-01-10 DIAGNOSIS — Z87891 Personal history of nicotine dependence: Secondary | ICD-10-CM | POA: Diagnosis not present

## 2017-01-10 DIAGNOSIS — I10 Essential (primary) hypertension: Secondary | ICD-10-CM | POA: Diagnosis present

## 2017-01-10 DIAGNOSIS — Z800 Family history of malignant neoplasm of digestive organs: Secondary | ICD-10-CM | POA: Diagnosis not present

## 2017-01-10 DIAGNOSIS — E669 Obesity, unspecified: Secondary | ICD-10-CM | POA: Diagnosis present

## 2017-01-10 DIAGNOSIS — G8194 Hemiplegia, unspecified affecting left nondominant side: Secondary | ICD-10-CM | POA: Diagnosis present

## 2017-01-10 LAB — ECHOCARDIOGRAM COMPLETE
Height: 67 in
Weight: 3396.85 oz

## 2017-01-10 LAB — LIPID PANEL
CHOL/HDL RATIO: 2.8 ratio
CHOLESTEROL: 148 mg/dL (ref 0–200)
HDL: 52 mg/dL (ref >40)
LDL Cholesterol: 77 mg/dL (ref 0–99)
TRIGLYCERIDES: 94 mg/dL (ref <150)
VLDL: 19 mg/dL (ref 0–40)

## 2017-01-10 LAB — CBC
HCT: 37.8 % (ref 36.0–46.0)
Hemoglobin: 12.4 g/dL (ref 12.0–15.0)
MCH: 30.5 pg (ref 26.0–34.0)
MCHC: 32.8 g/dL (ref 30.0–36.0)
MCV: 93.1 fL (ref 78.0–100.0)
PLATELETS: 212 10*3/uL (ref 150–400)
RBC: 4.06 MIL/uL (ref 3.87–5.11)
RDW: 13 % (ref 11.5–15.5)
WBC: 6.8 10*3/uL (ref 4.0–10.5)

## 2017-01-10 LAB — BASIC METABOLIC PANEL
ANION GAP: 5 (ref 5–15)
BUN: 10 mg/dL (ref 6–20)
CO2: 25 mmol/L (ref 22–32)
Calcium: 8.2 mg/dL — ABNORMAL LOW (ref 8.9–10.3)
Chloride: 109 mmol/L (ref 101–111)
Creatinine, Ser: 0.92 mg/dL (ref 0.44–1.00)
GFR calc Af Amer: >60 mL/min (ref >60)
Glucose, Bld: 92 mg/dL (ref 65–99)
POTASSIUM: 3.9 mmol/L (ref 3.5–5.1)
SODIUM: 139 mmol/L (ref 135–145)

## 2017-01-10 LAB — HEMOGLOBIN A1C
Hgb A1c MFr Bld: 4.8 % (ref 4.8–5.6)
MEAN PLASMA GLUCOSE: 91.06 mg/dL

## 2017-01-10 LAB — SEDIMENTATION RATE: SED RATE: 3 mm/h (ref 0–22)

## 2017-01-10 MED ORDER — GADOBENATE DIMEGLUMINE 529 MG/ML IV SOLN
20.0000 mL | Freq: Once | INTRAVENOUS | Status: AC | PRN
  Administered 2017-01-10: 20 mL via INTRAVENOUS

## 2017-01-10 MED ORDER — VENLAFAXINE HCL ER 75 MG PO CP24
300.0000 mg | ORAL_CAPSULE | Freq: Every day | ORAL | Status: DC
  Administered 2017-01-10 – 2017-01-11 (×2): 300 mg via ORAL
  Filled 2017-01-10: qty 4
  Filled 2017-01-10: qty 8
  Filled 2017-01-10: qty 4

## 2017-01-10 MED ORDER — ASPIRIN 300 MG RE SUPP: 300.0000 mg | Freq: Every day | RECTAL | Status: DC

## 2017-01-10 MED ORDER — ATORVASTATIN CALCIUM 10 MG PO TABS
10.0000 mg | ORAL_TABLET | Freq: Every day | ORAL | Status: DC
  Administered 2017-01-10: 10 mg via ORAL
  Filled 2017-01-10: qty 1

## 2017-01-10 MED ORDER — ASPIRIN EC 325 MG PO TBEC
325.0000 mg | DELAYED_RELEASE_TABLET | Freq: Every day | ORAL | Status: DC
  Administered 2017-01-10 – 2017-01-11 (×2): 325 mg via ORAL
  Filled 2017-01-10 (×3): qty 1

## 2017-01-10 NOTE — Progress Notes (Signed)
OT Cancellation Note  Patient Details Name: Virginia Grant MRN: [id number] DOB: 1975-08-31   Cancelled Treatment:    Reason Eval/Treat Not Completed: Patient at procedure or test/ unavailable (in vascular lab)  [facility] [name], OT/L  [phone] 01/10/2017 01/10/2017, 1:57 PM

## 2017-01-10 NOTE — Progress Notes (Signed)
  Echocardiogram 2D Echocardiogram has been performed.  Roosvelt Maser F 01/10/2017, 1:33 PM

## 2017-01-10 NOTE — Progress Notes (Signed)
Transcranial Bubble study completed.  Dr. Pearlean Brownie performed. Right MCA insonated. Right forearm IV used.  Verbal consent taken and risks/ benefits explained.  No HITS at rest or during Valsalva.  No apparent PFO.   Farrel Demark, RDMS, RVT 01/10/2017

## 2017-01-10 NOTE — Evaluation (Signed)
SLP Cancellation Note  Patient Details Name: Sarenity Ramaker MRN: [id number] DOB: 09-26-1975   Cancelled treatment:       Reason Eval/Treat Not Completed: Patient at procedure or test/unavailable  Pt at MRI, will continue efforts.   Chales Abrahams 01/10/2017, 9:35 AM

## 2017-01-10 NOTE — Progress Notes (Signed)
OT Cancellation Note  Patient Details Name: Virginia Grant MRN: [id number] DOB: 1976-03-20   Cancelled Treatment:    Reason Eval/Treat Not Completed: Patient at procedure or test/ unavailable  [facility] Delwin Raczkowski, OT/L  [phone] 01/10/2017 01/10/2017, 9:58 AM

## 2017-01-10 NOTE — Progress Notes (Signed)
STROKE TEAM PROGRESS NOTE   HISTORY OF PRESENT ILLNESS (per record) Virginia Grant is a 41 y.o. female past medical history of anxiety and depression, who was in her usual state of health until Saturday, when she noticed some left lower facial drooping, and numbness in her left arm and leg. She attributed her symptoms to possibly having consumed a couple of alcoholic drinks and/or may be having Bell's palsy.  She was out of town and was driving back with her husband. She came to the hospital today because she noticed that she is having difficulty lifting her left leg while negotiating the stairs.  Noncontrast CT of the head was done at [facility], that showed right internal capsular hypodensity.  She denies any prior similar episodes. Denies any episodes of blurred vision. She had headaches that were migrainous in nature till her late 62s. She is not on hormonal contraception. She has no history of miscarriages. No history of clots in the lungs or legs. She has 1 child.  Her mother has rheumatoid arthritis. Her sister had one stillbirth and 1 miscarriage due to some clotting disorder for which she is on aspirin.  Patient denies any neck pain, whiplash-like injury to the neck, chiropractic manipulation.  She denies any episodes of monocular visual loss or blurred vision, denies shocklike sensation down the spine while bending her neck or tingling or numbness while she has increased core body temperature such as during hot shower or while in a hot tub.  No family history of multiple sclerosis.    LKW: Saturday 9 PM 01/07/2017 Premorbid modified Rankin scale (mRS):0   Patient was not administered IV t-PA secondary to presenting outside of the tPA treatment window. She was admitted to General Neurology for further evaluation and treatment.   SUBJECTIVE (INTERVAL HISTORY) Her nurse is at the bedside.  The patient is awake, alert, and follows all commands appropriately.  L-sided facial droop  and l-sided weakness on exam.  Selected hypercoagulable workup ordered.  TCD bubble study and TTE negative for PFO.  DVT US, Carotid US, and TEE pending.  She denies drug abuse, history of DVT, pulmonary embolism, recurrent miscarriages, rash. No family history of strokes or heart attacks at a young age.   OBJECTIVE Temp:  [97.7 F (36.5 C)-98.3 F (36.8 C)] 97.8 F (36.6 C) (09/18 0500) Pulse Rate:  [57-93] 57 (09/18 0500) Cardiac Rhythm: Normal sinus rhythm (09/18 0700) Resp:  [18] 18 (09/18 0500) BP: (105-124)/(61-71) 113/71 (09/18 0500) SpO2:  [96 %-100 %] 96 % (09/18 0500)  CBC:   Recent Labs Lab 01/09/17 1228 01/10/17 0648  WBC 7.1 6.8  NEUTROABS 5.0  --   HGB 13.3 12.4  HCT 38.6 37.8  MCV 93.2 93.1  PLT 251 212    Basic Metabolic Panel:   Recent Labs Lab 01/09/17 1228 01/10/17 0648  NA 138 139  K 3.7 3.9  CL 107 109  CO2 23 25  GLUCOSE 111* 92  BUN 14 10  CREATININE 0.85 0.92  CALCIUM 8.8* 8.2*    Lipid Panel:     Component Value Date/Time   CHOL 148 01/10/2017 0258   TRIG 94 01/10/2017 0258   HDL 52 01/10/2017 0258   CHOLHDL 2.8 01/10/2017 0258   VLDL 19 01/10/2017 0258   LDLCALC 77 01/10/2017 0258   HgbA1c:  Lab Results  Component Value Date   HGBA1C 4.8 01/10/2017   Urine Drug Screen: No results found for: LABOPIA, COCAINSCRNUR, LABBENZ, AMPHETMU, THCU, LABBARB  Alcohol  Level No results found for: ETH  IMAGING  Ct Angio Head W Or Wo Contrast Ct Angio Neck W Or Wo Contrast 01/09/2017 IMPRESSION: 1. Normal CTA of the head and neck. 2. 19 mm evolving acute ischemic infarct within the right internal capsule/corona radiata.  Ct Head Wo Contrast 01/09/2017 IMPRESSION: Recent infarct or other asymmetric abnormality in the anterior limb right internal capsule.   Mr Laqueta Jean Wo Contrast 01/10/2017 IMPRESSION: Acute infarct in the posterior limb internal capsule and deep white matter tracts on the right. Additional smaller area of chronic infarct  in the deep white matter on the right. No other signs of demyelinating disease identified.  TTE 01/10/2017 Study Conclusions - Left ventricle: The cavity size was normal. Systolic function was normal. Wall motion was normal; there were no regional wall motion abnormalities. - Atrial septum: No defect or patent foramen ovale was identified. - Pulmonic valve: Peak gradient (S): 16 mm Hg.  Carotid US 01/10/2017  pending  Lower Extremity US (DVT) 01/10/2017  pending  TEE 01/10/2017 pending  TCD Bubble Study 01/10/2017 No HITS at rest or during Valsalva. No apparent PFO.   PHYSICAL EXAM Pleasant middle aged Caucasian lady not in distress.  . Afebrile. Head is nontraumatic. Neck is supple without bruit.    Cardiac exam no murmur or gallop. Lungs are clear to auscultation. Distal pulses are well felt. Neurological Exam :  Awake alert oriented x 3 normal speech and language. Mild left lower face weakness. Tongue midline. No drift.Left hemiplegia 4/5. Mild diminished fine finger movements on left. Orbits right over left upper extremity. Mild left grip [email or web address] left hip flexor and ankle dorsiflexor weakness Normal sensation . Normal coordination.Gait deferred  ASSESSMENT/PLAN Virginia Grant is a 41 y.o. female with history of anxiety and depression, who was in her usual state of health until Saturday, when she noticed some left lower facial drooping, and numbness in her left arm and leg. She did not receive IV t-PA due to presenting outside of the tPA treatment window.   Stroke: Acute R posterior limb internal capsule/deep white matter and chronic R deep white matter infarcts, lacunar versus embolic occlusion of deep perforators, of undetermined etiology.     Resultant  Mild left hemiplegia  CT head: Recent infarct in the anterior limb right internal capsule.  MRI head:  Acute R posterior limb internal capsule/deep white matter and chronic R deep white matter infarcts  MRA head:  Not performed  Carotid Doppler   pending  2D Echo: Systolic function normal.  No source of embolus.  TCD Bubble Study: No PFO  TEE:  pending   LDL 77  HgbA1c 4.8  SCDs for VTE prophylaxis Diet regular Room service appropriate? Yes; Fluid consistency: Thin Diet NPO time specified  No antithrombotic prior to admission, now on aspirin 325 mg daily  Patient counseled to be compliant with her antithrombotic medications  Ongoing aggressive stroke risk factor management  Therapy recommendations:   pending  Disposition: pending  Hyperlipidemia  Home meds: none  LDL 77, goal < 70  Add atorvastatin 10 mg PO daily  Continue statin at discharge  Other Stroke Risk Factors  ETOH use, advised to drink no more than 1 drink(s) a day  Obesity, Body mass index is 33.25 kg/m., recommend weight loss, diet and exercise as appropriate   Other Active Problems  None  Hospital day # 0  I have personally examined this patient, reviewed notes, independently viewed imaging studies, participated in medical decision making and  plan of care.ROS completed by me personally and pertinent positives fully documented  I have made any additions or clarifications directly to the above note. She presented with left hemiplegia due to large right basal ganglia infarct etiology to be determined but likely cryptogenic given absence of typical risk factors. Recommend check labs for vasculitis, hypercoagulable panel and TEE. Start aspirin for stroke prevention and therapy consults. Greater than 50% time during this 35 minute visit was spent on counseling and coordination of care about her cryptogenic stroke, discussion and evaluation, prevention and treatment and answered questions. Delia Heady, MD Medical Director [facility] Pager: [phone] 01/10/2017 3:16 PM   To contact Stroke Continuity provider, please refer to [email or web address]. After hours, contact General Neurology

## 2017-01-10 NOTE — Progress Notes (Signed)
PT Cancellation Note  Patient Details Name: Virginia Grant MRN: [id number] DOB: June 16, 1975   Cancelled Treatment:    Reason Eval/Treat Not Completed: Patient at procedure or test/unavailable   PT to check back as time allows.  Moise Boring, [place] #[phone]  Moise Boring 01/10/2017, 1:44 PM

## 2017-01-10 NOTE — Progress Notes (Signed)
Occupational Therapy Evaluation Patient Details Name: Virginia Grant MRN: [id number] DOB: 01-03-1976 Today's Date: 01/10/2017    History of Present Illness 41 y.o. female past medical history of anxiety and depression, who was in her usual state of health until Saturday, when she noticed some left lower facial drooping, and numbness in her left arm and leg. MRI + Acute infarct in the posterior limb internal capsule and deep white matter tracts on the right.   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 Pt presents with deficits listed below and currently requires minguard A with mobility and ADL. Will benefit from follow up OT at the neuro outpt center to maximize functional level of independence with ADL and IADL and facilitate safe return to work. Will follow acutely to address established goals and facilitate DC to next venue of care.     Follow Up Recommendations  Outpatient OT;Supervision - Intermittent (neuro outpt)    Equipment Recommendations  None recommended by OT    Recommendations for Other Services       Precautions / Restrictions Precautions Precautions: Fall      Mobility Bed Mobility Overal bed mobility: Modified Independent                Transfers Overall transfer level: Needs assistance   Transfers: Sit to/from Stand Sit to Stand: Supervision              Balance Overall balance assessment: Needs assistance   Sitting balance-Leahy Scale: Good       Standing balance-Leahy Scale: Good Standing balance comment: will further assess; able to retrieve items from floor with S                           ADL either performed or assessed with clinical judgement   ADL Overall ADL's : Needs assistance/impaired     Grooming: Set up;Supervision/safety;Standing Grooming Details (indicate cue type and reason): difficulty using L hand to complete tasks therefore pt  using hand over hand technique to complete task Upper Body Bathing: Set up;Sitting   Lower Body Bathing: Supervison/ safety;Min guard;Sit to/from stand   Upper Body Dressing : Supervision/safety;Set up;Sitting   Lower Body Dressing: Sit to/from stand;Min guard   Toilet Transfer: Min guard;Ambulation;Comfort height toilet   Toileting- Clothing Manipulation and Hygiene: Supervision/safety;Sit to/from stand       Functional mobility during ADLs: Min guard General ADL Comments: Use of LUE is clumsy, however, pt with good attention and attempts at uisng LUE in functional tasks. States "it feels different bue feels like it's getting a little better"     Vision Baseline Vision/History: Wears glasses Wears Glasses: At all times Patient Visual Report: Blurring of vision Vision Assessment?: Yes Eye Alignment: Within Functional Limits Ocular Range of Motion: Within Functional Limits Alignment/Gaze Preference: Within Defined Limits Tracking/Visual Pursuits: Able to track stimulus in all quads without difficulty Saccades: Within functional limits Convergence: Within functional limits Visual Fields: No apparent deficits Additional Comments: Pt coplains of "blurred vision in my L eye"     Perception Perception Comments: appears intact   Praxis Praxis Praxis tested?: Within functional limits    Pertinent Vitals/Pain Pain Assessment: 0-10 Pain Score: 3  Pain Location: L arm     Hand Dominance Right   Extremity/Trunk Assessment Upper Extremity Assessment Upper Extremity Assessment: LUE deficits/detail LUE Deficits / Details: AROM within funcitonal limits. Generalized  weakness but attempting to use functionally. Pt with apparent sensorimotor defictis affecting gros  and fine motor movements LUE Sensation: decreased light touch LUE Coordination: decreased fine motor;decreased gross motor   Lower Extremity Assessment Lower Extremity Assessment: Defer to PT evaluation   Cervical /  Trunk Assessment Cervical / Trunk Assessment: Normal   Communication Communication Communication: Expressive difficulties   Cognition Arousal/Alertness: Awake/alert Behavior During Therapy: Flat affect Overall Cognitive Status: Within Functional Limits for tasks assessed                                 General Comments: will further assess cognition. WFL for basic ADL tasks   General Comments       Exercises     Shoulder Instructions      Home Living Family/patient expects to be discharged to:: Private residence Living Arrangements: Spouse/significant other;Children Available Help at Discharge: Family;Friend(s) Type of Home: House Home Access: Stairs to enter [employer] of Steps: 6-8 Entrance Stairs-Rails: Can reach both Home Layout: One level     Bathroom Shower/Tub: Chief Strategy Officer: Standard Bathroom Accessibility: Yes How Accessible: Accessible via walker Home Equipment: Shower seat   Additional Comments: rollator if needed      Prior Functioning/Environment Level of Independence: Independent        Comments: teacher - special education; drives        OT Problem List: Decreased strength;Impaired balance (sitting and/or standing);Impaired vision/perception;Decreased coordination;Decreased safety awareness;Impaired sensation;Impaired UE functional use      OT Treatment/Interventions: Self-care/ADL training;Therapeutic exercise;Neuromuscular education;Therapeutic activities;DME and/or AE instruction;Visual/perceptual remediation/compensation;Patient/family education;Balance training    OT Goals(Current goals can be found in the care plan section) Acute Rehab OT Goals Patient Stated Goal: to return to work OT Goal Formulation: With patient Time For Goal Achievement: 01/24/17 Potential to Achieve Goals: Good  OT Frequency: Min 2X/week   Barriers to D/C:            Co-evaluation              AM-PAC PT  "6 Clicks" Daily Activity     Outcome Measure Help from another person eating meals?: None Help from another person taking care of personal grooming?: None Help from another person toileting, which includes using toliet, bedpan, or urinal?: A Little Help from another person bathing (including washing, rinsing, drying)?: A Little Help from another person to put on and taking off regular upper body clothing?: None Help from another person to put on and taking off regular lower body clothing?: A Little 6 Click Score: 21   End of Session Equipment Utilized During Treatment: Gait belt Nurse Communication: Mobility status  Activity Tolerance: Patient tolerated treatment well Patient left: in chair;with call bell/phone within reach;with family/visitor present  OT Visit Diagnosis: Unsteadiness on feet (R26.81);Muscle weakness (generalized) (M62.81)                Time: 1610-9604 OT Time Calculation (min): 27 min Charges:  OT General Charges $OT Visit: 1 Visit OT Evaluation $OT Eval Moderate Complexity: 1 Mod OT Treatments $Self Care/Home Management : 8-22 mins G-Codes: OT G-codes **NOT FOR INPATIENT CLASS** Functional Assessment Tool Used: Clinical judgement Functional Limitation: Self care Self Care Current Status (V4098): At least 1 percent but less than 20 percent impaired, limited or restricted Self Care Goal Status (J1914): At least 1 percent but less than 20 percent impaired, limited or restricted   [facility], OT/L  [phone] 01/10/2017  Cleo Santucci,[name] 01/10/2017, 4:25 PM

## 2017-01-10 NOTE — Progress Notes (Signed)
[facility]                                                                              Patient Demographics  Virginia Grant, is a 41 y.o. female, DOB - [birth year], ZOX:[id number]  Admit date - 01/09/2017   Admitting Physician Ozella Rocks, MD  Outpatient Primary MD for the patient is Sandford Craze, NP  Outpatient specialists:   LOS - 0  days   Medical records reviewed and are as summarized below:    Chief Complaint  Patient presents with  . Cerebrovascular Accident       Brief summary   Patient is a 41 year old female with history of anxiety, depression presented from med [employer] with strokelike symptoms. Patient reported that she was in her usual state of health until Saturday 2 days ago when she noted left-sided facial drooping. On the morning of admission she also noted slurred speech, left arm numbness and tingling, mild left lower extremity weakness. No prior history of TIA or stroke. Patient was not considered a TPA candidate due to delayed presentation.    Assessment & Plan    Principal Problem:   Acute CVA (cerebrovascular accident) (HCC) - CT angiogram of the head and neck showed 19 mm evolving acute ischemic infarct in the right internal capsule/corona radiata - MRI pending, follow 2-D echo - Neurology consulted, continue aspirin full dose - Lipid panel showed LDL 77, goal less than 70, placed on Lipitor 10 mg daily - SLP, PT, OT evaluation  Active Problems:   Essential hypertension, benign -Not on any antihypertensives, follow closely    Obesity - Patient counseled on diet and weight control, BMI 33.2     Depression with anxiety -Continue Effexor  Code Status: Full code DVT Prophylaxis: Heparin subcutaneous Family Communication: Discussed in detail with the patient, all imaging results, lab results explained to the patient   Disposition Plan:   Time Spent in minutes   25 minutes  Procedures:  CT angiogram  head and neck  Consultants:   Neurology  Antimicrobials:      Medications  Scheduled Meds: . aspirin EC  325 mg Oral Daily   Or  . aspirin  300 mg Rectal Daily  . atorvastatin  10 mg Oral q1800  . heparin  5,000 Units Subcutaneous Q8H  . venlafaxine XR  300 mg Oral Q breakfast   Continuous Infusions: . sodium chloride 75 mL/hr at 01/09/17 1800   PRN Meds:.acetaminophen **OR** acetaminophen (TYLENOL) oral liquid 160 mg/5 mL **OR** acetaminophen, hydrALAZINE, senna-docusate   Antibiotics   Anti-infectives    None        Subjective:   Virginia Grant was seen and examined today. Still feels subtle left-sided weakness and numbness and tingling. Patient denies dizziness, chest pain, shortness of breath, abdominal pain, N/V/D/C.    Objective:   Vitals:   01/09/17 2300 01/10/17 0100 01/10/17 0300 01/10/17 0500  BP: 109/61 108/64 105/62 113/71  Pulse: 70 71 74 (!) 57  Resp: Temp:    97.8 F (36.6 C)  TempSrc:    Oral  SpO2: 96% 99% 97% 96%  Weight:      Height:       No intake or output data in the 24 hours ending 01/10/17 1050   Wt Readings from Last 3 Encounters:  01/09/17 96.3 kg (212 lb 4.9 oz)  01/09/17 98 kg (216 lb)  08/30/13 102.1 kg (225 lb)     Exam  General: Alert and oriented x 3, NAD  Eyes: PERRLA, EOMI, Anicteric Sclera,  HEENT:  Atraumatic, normocephalic, normal oropharynx  Cardiovascular: S1 S2 auscultated, no rubs, murmurs or gallops. Regular rate and rhythm.  Respiratory: Clear to auscultation bilaterally, no wheezing, rales or rhonchi  Gastrointestinal: Soft, nontender, nondistended, + bowel sounds  Ext: no pedal edema bilaterally  Neuro: AAOx3, Cr N's II- XII. Strength 5/5 right. Slight weakness on the left side.  Musculoskeletal: No digital cyanosis, clubbing  Skin: No rashes  Psych: Normal affect and demeanor, alert and oriented x3    Data Reviewed:  I have personally reviewed following labs and imaging  studies  Micro Results No results found for this or any previous visit (from the past 240 hour(s)).  Radiology Reports Ct Angio Head W Or Wo Contrast  Result Date: 01/09/2017 CLINICAL DATA:  Initial evaluation for intermittent slurred speech with left-sided facial numbness in hands EXAM: CT ANGIOGRAPHY HEAD AND NECK TECHNIQUE: Multidetector CT imaging of the head and neck was performed using the standard protocol during bolus administration of intravenous contrast. Multiplanar CT image reconstructions and MIPs were obtained to evaluate the vascular anatomy. Carotid stenosis measurements (when applicable) are obtained utilizing NASCET criteria, using the distal internal carotid diameter as the denominator. CONTRAST:  50 cc of Isovue 370. COMPARISON:  Prior CT from earlier the same day. FINDINGS: CTA NECK FINDINGS Aortic arch: Visualized aortic arch of normal caliber with normal branch pattern. No flow-limiting stenosis about the origin of the great vessels. Visualized subclavian artery is widely patent. Right carotid system: Right common and internal carotid artery's widely patent without stenosis, dissection, or occlusion. No atheromatous narrowing about the right carotid bifurcation. Left carotid system: Left common and internal carotid artery's widely patent without stenosis, dissection, or occlusion. No significant atheromatous narrowing about the left carotid bifurcation. Vertebral arteries: Both of the vertebral arteries arise from the subclavian arteries. Vertebral artery's widely patent without stenosis, dissection, or occlusion. Skeleton: No acute osseus abnormality. No worrisome lytic or blastic osseous lesions. Other neck: No acute soft tissue abnormality within the neck. Salivary glands normal. Thyroid normal. No adenopathy. Upper chest: Visualized upper chest within normal limits. Visualized lungs are clear. Review of the MIP images confirms the above findings CTA HEAD FINDINGS Anterior  circulation: The petrous, cavernous, and supraclinoid segments of the internal carotid arteries are widely patent without stenosis. ICA termini widely patent. A1 segments, anterior communicating artery common anterior cerebral arteries widely patent. M1 segments widely patent without stenosis or occlusion. MCA bifurcations normal. No proximal M2 occlusion. Distal MCA branches well perfused and symmetric. Posterior circulation: Vertebral artery's widely patent to the vertebrobasilar junction. Posterior inferior cerebral arteries patent bilaterally. Basilar artery widely patent to its distal aspect. Superior cerebellar and posterior cerebral arteries widely patent bilaterally. Venous sinuses: Patent. Anatomic variants: None significant. No aneurysm or vascular malformation. Delayed phase: No pathologic enhancement. Evolving 19 mm acute ischemic infarct noted within the right internal capsule/ corona radiata (series 13, image 17). Review of the MIP images confirms the above findings IMPRESSION: 1. Normal CTA of the head and neck. 2. 19 mm evolving acute ischemic infarct within the right internal capsule/corona  radiata. Electronically Signed   By: Rise Mu M.D.   On: 01/09/2017 19:53   Ct Head Wo Contrast  Result Date: 01/09/2017 CLINICAL DATA:  Left-sided facial droop and weakness since 01/07/2017. Slurred speech with left arm and leg numbness. EXAM: CT HEAD WITHOUT CONTRAST TECHNIQUE: Contiguous axial images were obtained from the base of the skull through the vertex without intravenous contrast. COMPARISON:  None. FINDINGS: Brain: There is low-density in the right internal capsule anterior limb, asymmetric and primarily suspicious for recent small-vessel infarct. Asymmetric periventricular white matter low density extending superiorly from the frontal horn right lateral ventricle, without mass effect. No hemorrhage or hydrocephalus. Vascular: No hyperdense vessel or unexpected calcification. Skull:  Negative Sinuses/Orbits: Negative Other: These results will be called to the ordering clinician or representative by the Radiologist Assistant, and communication documented in the PACS or zVision Dashboard. IMPRESSION: Recent infarct or other asymmetric abnormality in the anterior limb right internal capsule. Recommend MRI characterization. Electronically Signed   By: Marnee Spring M.D.   On: 01/09/2017 11:38   Ct Angio Neck W Or Wo Contrast  Result Date: 01/09/2017 CLINICAL DATA:  Initial evaluation for intermittent slurred speech with left-sided facial numbness in hands EXAM: CT ANGIOGRAPHY HEAD AND NECK TECHNIQUE: Multidetector CT imaging of the head and neck was performed using the standard protocol during bolus administration of intravenous contrast. Multiplanar CT image reconstructions and MIPs were obtained to evaluate the vascular anatomy. Carotid stenosis measurements (when applicable) are obtained utilizing NASCET criteria, using the distal internal carotid diameter as the denominator. CONTRAST:  50 cc of Isovue 370. COMPARISON:  Prior CT from earlier the same day. FINDINGS: CTA NECK FINDINGS Aortic arch: Visualized aortic arch of normal caliber with normal branch pattern. No flow-limiting stenosis about the origin of the great vessels. Visualized subclavian artery is widely patent. Right carotid system: Right common and internal carotid artery's widely patent without stenosis, dissection, or occlusion. No atheromatous narrowing about the right carotid bifurcation. Left carotid system: Left common and internal carotid artery's widely patent without stenosis, dissection, or occlusion. No significant atheromatous narrowing about the left carotid bifurcation. Vertebral arteries: Both of the vertebral arteries arise from the subclavian arteries. Vertebral artery's widely patent without stenosis, dissection, or occlusion. Skeleton: No acute osseus abnormality. No worrisome lytic or blastic osseous  lesions. Other neck: No acute soft tissue abnormality within the neck. Salivary glands normal. Thyroid normal. No adenopathy. Upper chest: Visualized upper chest within normal limits. Visualized lungs are clear. Review of the MIP images confirms the above findings CTA HEAD FINDINGS Anterior circulation: The petrous, cavernous, and supraclinoid segments of the internal carotid arteries are widely patent without stenosis. ICA termini widely patent. A1 segments, anterior communicating artery common anterior cerebral arteries widely patent. M1 segments widely patent without stenosis or occlusion. MCA bifurcations normal. No proximal M2 occlusion. Distal MCA branches well perfused and symmetric. Posterior circulation: Vertebral artery's widely patent to the vertebrobasilar junction. Posterior inferior cerebral arteries patent bilaterally. Basilar artery widely patent to its distal aspect. Superior cerebellar and posterior cerebral arteries widely patent bilaterally. Venous sinuses: Patent. Anatomic variants: None significant. No aneurysm or vascular malformation. Delayed phase: No pathologic enhancement. Evolving 19 mm acute ischemic infarct noted within the right internal capsule/ corona radiata (series 13, image 17). Review of the MIP images confirms the above findings IMPRESSION: 1. Normal CTA of the head and neck. 2. 19 mm evolving acute ischemic infarct within the right internal capsule/corona radiata. Electronically Signed   By: Rise Mu  M.D.   On: 01/09/2017 19:53    Lab Data:  CBC:  Recent Labs Lab 01/09/17 1228 01/10/17 0648  WBC 7.1 6.8  NEUTROABS 5.0  --   HGB 13.3 12.4  HCT 38.6 37.8  MCV 93.2 93.1  PLT 251 212   Basic Metabolic Panel:  Recent Labs Lab 01/09/17 1228 01/10/17 0648  NA 138 139  K 3.7 3.9  CL 107 109  CO2 23 25  GLUCOSE 111* 92  BUN 14 10  CREATININE 0.85 0.92  CALCIUM 8.8* 8.2*   GFR: Estimated Creatinine Clearance: 94 mL/min (by C-G formula based  on SCr of 0.92 mg/dL). Liver Function Tests: No results for input(s): AST, ALT, ALKPHOS, BILITOT, PROT, ALBUMIN in the last 168 hours. No results for input(s): LIPASE, AMYLASE in the last 168 hours. No results for input(s): AMMONIA in the last 168 hours. Coagulation Profile:  Recent Labs Lab 01/09/17 1228  INR 1.00   Cardiac Enzymes: No results for input(s): CKTOTAL, CKMB, CKMBINDEX, TROPONINI in the last 168 hours. BNP (last 3 results) No results for input(s): PROBNP in the last 8760 hours. HbA1C:  Recent Labs  01/10/17 0258  HGBA1C 4.8   CBG: No results for input(s): GLUCAP in the last 168 hours. Lipid Profile:  Recent Labs  01/10/17 0258  CHOL 148  HDL 52  LDLCALC 77  TRIG 94  CHOLHDL 2.8   Thyroid Function Tests: No results for input(s): TSH, T4TOTAL, FREET4, T3FREE, THYROIDAB in the last 72 hours. Anemia Panel: No results for input(s): VITAMINB12, FOLATE, FERRITIN, TIBC, IRON, RETICCTPCT in the last 72 hours. Urine analysis:    Component Value Date/Time   BILIRUBINUR negative 07/15/2013 1038   PROTEINUR negative 07/15/2013 1038   UROBILINOGEN negative 07/15/2013 1038   NITRITE negative 07/15/2013 1038   LEUKOCYTESUR Negative 07/15/2013 1038     Ripudeep Rai M.D. [facility] 01/10/2017, 10:50 AM  Pager: [phone] Between 7am to 7pm - call Pager - [phone]  After 7pm go to [email or web address] - password [email or web address]  Call night coverage person covering after 7pm

## 2017-01-10 NOTE — Care Management Note (Signed)
Case Management Note  Patient Details  Name: Selina Tapper MRN: [id number] Date of Birth: March 25, 1976  Subjective/Objective:    Pt in to r/o CVA. She is from home with spouse.               Action/Plan: Awaiting PT/OT recommendations. CM following for d/c needs, physician orders.   Expected Discharge Date:                  Expected Discharge Plan:     In-House Referral:     Discharge planning Services     Post Acute Care Choice:    Choice offered to:     DME Arranged:    DME Agency:     HH Arranged:    HH Agency:     Status of Service:  In process, will continue to follow  If discussed at Long Length of Stay Meetings, dates discussed:    Additional Comments:  Kermit Balo, RN 01/10/2017, 10:41 AM

## 2017-01-10 NOTE — Progress Notes (Signed)
01/10/17 1603  PT Evaluation  Last PT Received On 01/10/17  Assistance Needed +1  History of Present Illness 41 y.o. female past medical history of anxiety and depression, who was in her usual state of health until Saturday, when she noticed some left lower facial drooping, and numbness in her left arm and leg. MRI + Acute infarct in the posterior limb internal capsule and deep white matter tracts on the right.  Precautions  Precautions Fall  Precaution Comments left sided weakness  Home Living  Family/patient expects to be discharged to: Private residence  Living Arrangements Spouse/significant other;Children  Available Help at Discharge Family;Friend(s)  Type of Home House  Home Access Stairs to enter  Entrance Stairs-Number of Steps 6-8  Entrance Stairs-Rails Can reach both  Home Layout One level  Bathroom Shower/Tub Tub/shower unit;Curtain  Barista  Additional Comments rollator if needed  Prior Function  Level of Independence Independent  Comments teacher - special education; drives  Communication  Communication Expressive difficulties  Pain Assessment  Pain Assessment 0-10  Pain Score 3  Pain Location L arm  Pain Descriptors / Indicators Burning  Pain Intervention(s) Limited activity within patient's tolerance;Monitored during session;Repositioned  Cognition  Arousal/Alertness Awake/alert  Behavior During Therapy Flat affect  Overall Cognitive Status Within Functional Limits for tasks assessed  General Comments will further assess cognition. WFL for basic ADL tasks  Upper Extremity Assessment  Upper Extremity Assessment Defer to OT evaluation  Lower Extremity Assessment  Lower Extremity Assessment LLE deficits/detail  RLE Deficits / Details WNL sensation, strength, and coordination testing  RLE Sensation (intact to LT)  RLE Coordination (normal heel to shin test)  LLE Deficits / Details Knee  flex/ext, Ankle PF/DF, and hip Flex MMT all 4/5  LLE Sensation (intact to LT, however, "feels different")  LLE Coordination (heel to shin normal, but functionally impaired)  Cervical / Trunk Assessment  Cervical / Trunk Assessment Normal  Bed Mobility  Overal bed mobility Modified Independent  Transfers  Overall transfer level Needs assistance  Equipment used None  Transfers Sit to/from Stand  Sit to Stand Supervision  General transfer comment supervision for safety  Ambulation/Gait  Ambulation/Gait assistance Min guard  Ambulation Distance (Feet) 300 Feet  Assistive device None  Gait Pattern/deviations Step-through pattern;Decreased step length - left;Decreased dorsiflexion - left (high guard with decreased arm swing)  General Gait Details Pt with high guard, slow gait pattern, decreased strength and coordination in left leg.   Gait pattern improved with increased gait distance and confidence  Gait velocity 1.74 ft/s  Gait velocity interpretation <1.8 ft/sec, indicative of risk for recurrent falls  Stairs Yes  Stairs assistance Min guard  Stair Management Two rails  Number of Stairs 5  General stair comments Pt began amb stairs by doing step to pattern leading with R leg, but was able to progress to alt step through pattern using both R and L legs. Min guard assist for safety especially as she tried alternating pattern  Modified Rankin (Stroke Patients Only)  Pre-Morbid Rankin Score 0  Modified Rankin 4  Balance  Overall balance assessment Needs assistance  Sitting-balance support No upper extremity supported;Feet supported  Sitting balance-Leahy Scale Good  Standing balance support No upper extremity supported  Standing balance-Leahy Scale Good  Standing balance comment Apparent deficits, but safe on her feet.   Standardized Balance Assessment  Standardized Balance Assessment  Dynamic Gait Index  Dynamic Gait Index  Level Surface  2  Change in Gait Speed 2  Gait with  Horizontal Head Turns 2  Gait with Vertical Head Turns 2  Gait and Pivot Turn 2  Step Over Obstacle 2  Step Around Obstacles 3  Steps 2  Total Score 17  PT - End of Session  Equipment Utilized During Treatment Gait belt  Activity Tolerance Patient tolerated treatment well  Patient left in bed;with call bell/phone within reach  PT Assessment  PT Recommendation/Assessment Patient needs continued PT services  PT Visit Diagnosis Muscle weakness (generalized) (M62.81);Difficulty in walking, not elsewhere classified (R26.2);Hemiplegia and hemiparesis  Hemiplegia - Right/Left Left  Hemiplegia - dominant/non-dominant Non-dominant  Hemiplegia - caused by Cerebral infarction  PT Problem List Decreased strength;Decreased activity tolerance;Decreased balance;Decreased mobility;Decreased coordination;Impaired sensation  PT Plan  PT Frequency (ACUTE ONLY) Min 4X/week  PT Treatment/Interventions (ACUTE ONLY) DME instruction;Gait training;Stair training;Functional mobility training;Therapeutic activities;Therapeutic exercise;Balance training;Neuromuscular re-education;Patient/family education  AM-PAC PT "6 Clicks" Daily Activity Outcome Measure  Difficulty turning over in bed (including adjusting bedclothes, sheets and blankets)? 3  Difficulty moving from lying on back to sitting on the side of the bed?  3  Difficulty sitting down on and standing up from a chair with arms (e.g., wheelchair, bedside commode, etc,.)? 4  Help needed moving to and from a bed to chair (including a wheelchair)? 3  Help needed walking in hospital room? 3  Help needed climbing 3-5 steps with a railing?  3  6 Click Score 19  Mobility G Code  CJ  PT Recommendation  Follow Up Recommendations Outpatient PT  PT equipment None recommended by PT  Individuals Consulted  Consulted and Agree with Results and Recommendations Patient  Acute Rehab PT Goals  Patient Stated Goal to return to work and return back to functional  activities including working out  PT Goal Formulation With patient  Time For Goal Achievement 01/24/17  Potential to Achieve Goals Good  PT Time Calculation  PT Start Time (ACUTE ONLY) 1601  PT Stop Time (ACUTE ONLY) 1627  PT Time Calculation (min) (ACUTE ONLY) 26 min  PT General Charges  $$ ACUTE PT VISIT 1 Visit  PT Evaluation  $PT Eval Low Complexity 1 Low  PT Treatments  $Gait Training 8-22 mins  Written Expression  Dominant Hand Right  01/10/2017 Pt with some mild left sided deficits and gait instability.  She would benefit from OP PT f/u for balance, gait, and strength training.   PT to follow acutely for deficits listed above. Rollene Rotunda Cuma Polyakov, PT, DPT [phone]

## 2017-01-11 LAB — CBC
HCT: 38.3 % (ref 36.0–46.0)
Hemoglobin: 12.6 g/dL (ref 12.0–15.0)
MCH: 30.6 pg (ref 26.0–34.0)
MCHC: 32.9 g/dL (ref 30.0–36.0)
MCV: 93 fL (ref 78.0–100.0)
PLATELETS: 232 10*3/uL (ref 150–400)
RBC: 4.12 MIL/uL (ref 3.87–5.11)
RDW: 12.8 % (ref 11.5–15.5)
WBC: 7.8 10*3/uL (ref 4.0–10.5)

## 2017-01-11 LAB — BASIC METABOLIC PANEL
Anion gap: 6 (ref 5–15)
BUN: 9 mg/dL (ref 6–20)
CALCIUM: 8.7 mg/dL — AB (ref 8.9–10.3)
CHLORIDE: 107 mmol/L (ref 101–111)
CO2: 24 mmol/L (ref 22–32)
CREATININE: 0.83 mg/dL (ref 0.44–1.00)
GFR calc Af Amer: >60 mL/min (ref >60)
Glucose, Bld: 113 mg/dL — ABNORMAL HIGH (ref 65–99)
Potassium: 4.1 mmol/L (ref 3.5–5.1)
SODIUM: 137 mmol/L (ref 135–145)

## 2017-01-11 LAB — ANA W/REFLEX IF POSITIVE: Anti Nuclear Antibody(ANA): NEGATIVE

## 2017-01-11 MED ORDER — ATORVASTATIN CALCIUM 10 MG PO TABS: 10.0000 mg | ORAL_TABLET | Freq: Every day | ORAL | 0 refills | Status: DC

## 2017-01-11 MED ORDER — ASPIRIN 325 MG PO TBEC: 325.0000 mg | DELAYED_RELEASE_TABLET | Freq: Every day | ORAL | 0 refills | Status: DC

## 2017-01-11 NOTE — Progress Notes (Addendum)
STROKE TEAM PROGRESS NOTE   HISTORY OF PRESENT ILLNESS (per record) Virginia Grant is a 41 y.o. female past medical history of anxiety and depression, who was in her usual state of health until Saturday, when she noticed some left lower facial drooping, and numbness in her left arm and leg. She attributed her symptoms to possibly having consumed a couple of alcoholic drinks and/or may be having Bell's palsy.  She was out of town and was driving back with her husband. She came to the hospital today because she noticed that she is having difficulty lifting her left leg while negotiating the stairs.  Noncontrast CT of the head was done at [facility], that showed right internal capsular hypodensity.  She denies any prior similar episodes. Denies any episodes of blurred vision. She had headaches that were migrainous in nature till her late 62s. She is not on hormonal contraception. She has no history of miscarriages. No history of clots in the lungs or legs. She has 1 child.  Her mother has rheumatoid arthritis. Her sister had one stillbirth and 1 miscarriage due to some clotting disorder for which she is on aspirin.  Patient denies any neck pain, whiplash-like injury to the neck, chiropractic manipulation.  She denies any episodes of monocular visual loss or blurred vision, denies shocklike sensation down the spine while bending her neck or tingling or numbness while she has increased core body temperature such as during hot shower or while in a hot tub.  No family history of multiple sclerosis.    LKW: Saturday 9 PM 01/07/2017 Premorbid modified Rankin scale (mRS):0   Patient was not administered IV t-PA secondary to presenting outside of the tPA treatment window. She was admitted to General Neurology for further evaluation and treatment.   SUBJECTIVE (INTERVAL HISTORY) Her nurse is at the bedside.  The patient is awake, alert, and follows all commands appropriately.  L-sided facial droop  and l-sided weakness are improving.  Selected hypercoagulable workup pending.   and TEE pending.   LDL cholesterol was borderline at 77 and 11 A1c is normal.  OBJECTIVE Temp:  [97.6 F (36.4 C)-98.9 F (37.2 C)] 98 F (36.7 C) (09/19 1426) Pulse Rate:  [62-87] 70 (09/19 1426) Cardiac Rhythm: Normal sinus rhythm (09/19 0700) Resp:  [15-20] 17 (09/19 1426) BP: (107-129)/(50-71) 116/71 (09/19 1426) SpO2:  [96 %-100 %] 96 % (09/19 1426)  CBC:   Recent Labs Lab 01/09/17 1228 01/10/17 0648 01/11/17 0644  WBC 7.1 6.8 7.8  NEUTROABS 5.0  --   --   HGB 13.3 12.4 12.6  HCT 38.6 37.8 38.3  MCV 93.2 93.1 93.0  PLT 251 212 232    Basic Metabolic Panel:   Recent Labs Lab 01/10/17 0648 01/11/17 0719  NA 139 137  K 3.9 4.1  CL 109 107  CO2 25 24  GLUCOSE 92 113*  BUN 10 9  CREATININE 0.92 0.83  CALCIUM 8.2* 8.7*    Lipid Panel:     Component Value Date/Time   CHOL 148 01/10/2017 0258   TRIG 94 01/10/2017 0258   HDL 52 01/10/2017 0258   CHOLHDL 2.8 01/10/2017 0258   VLDL 19 01/10/2017 0258   LDLCALC 77 01/10/2017 0258   HgbA1c:  Lab Results  Component Value Date   HGBA1C 4.8 01/10/2017   Urine Drug Screen: No results found for: LABOPIA, COCAINSCRNUR, LABBENZ, AMPHETMU, THCU, LABBARB  Alcohol Level No results found for: ETH  IMAGING  Ct Angio Head W Or Wo  Contrast Ct Angio Neck W Or Wo Contrast 01/09/2017 IMPRESSION: 1. Normal CTA of the head and neck. 2. 19 mm evolving acute ischemic infarct within the right internal capsule/corona radiata.  Ct Head Wo Contrast 01/09/2017 IMPRESSION: Recent infarct or other asymmetric abnormality in the anterior limb right internal capsule.   Mr Laqueta Jean Wo Contrast 01/10/2017 IMPRESSION: Acute infarct in the posterior limb internal capsule and deep white matter tracts on the right. Additional smaller area of chronic infarct in the deep white matter on the right. No other signs of demyelinating disease  identified.  TTE 01/10/2017 Study Conclusions - Left ventricle: The cavity size was normal. Systolic function was normal. Wall motion was normal; there were no regional wall motion abnormalities. - Atrial septum: No defect or patent foramen ovale was identified. - Pulmonic valve: Peak gradient (S): 16 mm Hg.  Carotid US 01/10/2017  pending  Lower Extremity US (DVT) 01/10/2017  pending  TEE 01/10/2017 pending  TCD Bubble Study 01/10/2017 No HITS at rest or during Valsalva. No apparent PFO.   PHYSICAL EXAM Pleasant middle aged Caucasian lady not in distress.  . Afebrile. Head is nontraumatic. Neck is supple without bruit.    Cardiac exam no murmur or gallop. Lungs are clear to auscultation. Distal pulses are well felt. Neurological Exam :  Awake alert oriented x 3 normal speech and language. Mild left lower face weakness. Tongue midline. No drift.Left hemiplegia 4/5. Mild diminished fine finger movements on left. Orbits right over left upper extremity. Mild left grip [email or web address] left hip flexor and ankle dorsiflexor weakness Normal sensation . Normal coordination.Gait deferred  ASSESSMENT/PLAN Ms. Virginia Grant is a 41 y.o. female with history of anxiety and depression, who was in her usual state of health until Saturday, when she noticed some left lower facial drooping, and numbness in her left arm and leg. She did not receive IV t-PA due to presenting outside of the tPA treatment window.   Stroke: Acute R posterior limb internal capsule/deep white matter and chronic R deep white matter infarcts, lacunar versus embolic occlusion of deep perforators, of undetermined etiology.     Resultant  Mild left hemiplegia  CT head: Recent infarct in the anterior limb right internal capsule.  MRI head:  Acute R posterior limb internal capsule/deep white matter and chronic R deep white matter infarcts  MRA head: Not performed  Carotid Doppler   pending  2D Echo: Systolic function normal.   No source of embolus.  TCD Bubble Study: No PFO  TEE:  pending   LDL 77  HgbA1c 4.8  SCDs for VTE prophylaxis Diet regular Room service appropriate? Yes; Fluid consistency: Thin  No antithrombotic prior to admission, now on aspirin 325 mg daily  Patient counseled to be compliant with her antithrombotic medications  Ongoing aggressive stroke risk factor management  Therapy recommendations:   Outpatient  Disposition: Home  Hyperlipidemia  Home meds: none  LDL 77, goal < 70  Add atorvastatin 10 mg PO daily  Continue statin at discharge  Other Stroke Risk Factors  ETOH use, advised to drink no more than 1 drink(s) a day  Obesity, Body mass index is 33.25 kg/m., recommend weight loss, diet and exercise as appropriate   Other Active Problems  None  Hospital day # 1   Recommend continue aspirin and statin. Discharge patient home with outpatient therapy and TEE as an outpatient. Follow-up in the stroke clinic in 6 weeks. Long discussion with the patient, son and Dr. Catha Gosselin and answered questions.  I have personally reviewed 48 hours of telemetry monitoring strips during this hospitalization and no evidence of A. fib was found. The patient is interested in participating in the [facility] stroke registry. Greater than 50% time during this 25 minute visit was spent on counseling and coordination of care about her cryptogenic stroke, discussion and evaluation, prevention and treatment and answered questions.  Delia Heady, MD Medical Director [facility] Pager: [phone] 01/11/2017 2:50 PM   To contact Stroke Continuity provider, please refer to [email or web address]. After hours, contact General Neurology

## 2017-01-11 NOTE — Progress Notes (Signed)
Physical Therapy Treatment Patient Details Name: Virginia Grant MRN: [id number] DOB: 1975/05/22 Today's Date: 01/11/2017    History of Present Illness 41 y.o. female past medical history of anxiety and depression, who was in her usual state of health until Saturday, when she noticed some left lower facial drooping, and numbness in her left arm and leg. MRI + Acute infarct in the posterior limb internal capsule and deep white matter tracts on the right.    PT Comments    Patient is progressing towards goals.  Patient able to ambulate with increased step length after receiving verbal cues and visual cues of targets on floor. Patient was able to complete a total of approx.15 stairs with min assist for minor loss of balance during ascent. Patient also able to complete mini squats with min guard for safety and equal weight distribution. Patient continues to present with the following deficits: decreased confidence in L LE, decreased hip and trunk dissociation, increased guarding during novel tasks, and decreased balance in L LE. After verbal cuing patient demonstrated decreased guarding with ambulation and increased step length, trunk rotation, and posture as session progressed. Patient is safe to ambulate with assistance from son. PT  will follow to maximize mobility.  Follow Up Recommendations  Outpatient PT     Equipment Recommendations  None recommended by PT    Recommendations for Other Services       Precautions / Restrictions Precautions Precautions: Fall Precaution Comments: left sided weakness Restrictions Weight Bearing Restrictions: Yes (left arm)    Mobility  Bed Mobility                  Transfers                    Ambulation/Gait Ambulation/Gait assistance: Min guard Ambulation Distance (Feet): 350 Feet Assistive device: None Gait Pattern/deviations: Step-through pattern;Decreased step length - right;Decreased step length - left;Decreased dorsiflexion  - left Gait velocity: 2.08  Gait velocity interpretation: Below normal speed for age/gender General Gait Details: Patient with decreased hip and trunk dissociation, high guard, decreased  stance time on L LE, and slight trunk flexion during gait. Patient was verbally cued to lengthen strides through using visual targets on the floor. Patient was also cued to maintain erect posture during gait. As session progressed, patient improved in confidence, posture, and stride length.    Stairs Stairs: Yes   Stair Management: No rails;One rail Right;One rail Left;Alternating pattern;Step to pattern Number of Stairs: 15 General stair comments: Patient was challenged to negotiate stairs with a stepthrough pattern while using one rail. Patient was then challenged to negotiate stairs without use of rail in order further challenge single leg balance required for functional mobility. Min guard assist for safety and min assist provided for minimal LOB ascending stair with L foot first. Patient unable to clear heel intermittantly and required micro adjustment to foot placement when stepping up with L LE.  Wheelchair Mobility    Modified Rankin (Stroke Patients Only)       Balance               Standing balance comment:  (Patient safe, deficits become apparent during gait )                            Cognition Arousal/Alertness: Awake/alert Behavior During Therapy: WFL for tasks assessed/performed Overall Cognitive Status: Within Functional Limits for tasks assessed  General Comments: will further assess cognition. WFL for basic ADL tasks      Exercises General Exercises - Lower Extremity Mini-Sqauts:  (Good form) Other Exercises Other Exercises: theraputty/fine motor/coordination activities - writtent information reviewed    General Comments        Pertinent Vitals/Pain Pain Assessment: No/denies pain    Home Living                       Prior Function            PT Goals (current goals can now be found in the care plan section) Acute Rehab PT Goals Patient Stated Goal: to return to work and return back to functional activities including working out PT Goal Formulation: With patient Time For Goal Achievement: 01/24/17 Potential to Achieve Goals: Good Progress towards PT goals: Progressing toward goals    Frequency    Min 4X/week      PT Plan Current plan remains appropriate    Co-evaluation              AM-PAC PT "6 Clicks" Daily Activity  Outcome Measure  Difficulty turning over in bed (including adjusting bedclothes, sheets and blankets)?: A Little Difficulty moving from lying on back to sitting on the side of the bed? : A Little Difficulty sitting down on and standing up from a chair with arms (e.g., wheelchair, bedside commode, etc,.)?: None Help needed moving to and from a bed to chair (including a wheelchair)?: A Little Help needed walking in hospital room?: A Little Help needed climbing 3-5 steps with a railing? : A Little 6 Click Score: 19    End of Session Equipment Utilized During Treatment: Gait belt Activity Tolerance: Patient tolerated treatment well Patient left: in bed;with call bell/phone within reach Nurse Communication: Mobility status PT Visit Diagnosis: Muscle weakness (generalized) (M62.81);Difficulty in walking, not elsewhere classified (R26.2);Hemiplegia and hemiparesis Hemiplegia - Right/Left: Left Hemiplegia - dominant/non-dominant: Non-dominant Hemiplegia - caused by: Cerebral infarction     Time:  -     Charges:                       G Codes:       Prince Couey SPT   Wiley Magan 01/11/2017, 2:57 PM

## 2017-01-11 NOTE — Evaluation (Signed)
Speech Language Pathology Evaluation Patient Details Name: Virginia Grant MRN: [id number] DOB: [birth year] Today's Date: 01/11/2017 Time: 4098-1191 SLP Time Calculation (min) (ACUTE ONLY): 27 min  Problem List:  Patient Active Problem List   Diagnosis Date Noted  . Acute CVA (cerebrovascular accident) (HCC) 01/10/2017  . Stroke (HCC) 01/10/2017  . Stroke (cerebrum) (HCC) 01/09/2017  . Anxiety state 01/09/2017  . Depression 01/09/2017  . Stroke-like symptoms   . Depression with anxiety   . Routine general medical examination at a health care facility 07/15/2013  . Need for prophylactic vaccination with combined diphtheria-tetanus-pertussis (DTP) vaccine 07/15/2013  . Other malaise and fatigue 07/15/2013  . Essential hypertension, benign 07/14/2013  . Obesity 07/14/2013   Past Medical History:  Past Medical History:  Diagnosis Date  . Anxiety   . Depression   . Hypertension    Past Surgical History:  Past Surgical History:  Procedure Laterality Date  . ESSURE TUBAL LIGATION    . NO PAST SURGERIES     HPI:  41 y.o. female past medical history of anxiety and depression, who was in her usual state of health until 9/15, when she noticed some left lower facial drooping, and numbness in her left arm and leg. MRI + Acute infarct in the posterior limb internal capsule and deep white matter tracts on the right.   Assessment / Plan / Recommendation Clinical Impression  Pt demonstrates speech and language within functional limits; mild cognitive deficits in the areas of working memory and processing of complex tasks. When given multi-step memory or problem-solving tasks, pt would exhibit difficulty recalling information and became frustrated but was able to complete tasks given the ability to write problems out on paper. Pt completed delayed recall task with 40% accuracy; recalled other items when given multiple choice cues. Recommend SLP outpatient therapy or neuropsych eval following d/c.  Pt is aware of both options.       SLP Assessment  SLP Recommendation/Assessment: Patient needs continued Speech Lanaguage Pathology Services SLP Visit Diagnosis: Frontal lobe and executive function deficit Frontal lobe and executive function deficit following: Cerebral infarction    Follow Up Recommendations       Frequency and Duration min 2x/week  2 weeks      SLP Evaluation Cognition  Overall Cognitive Status: Impaired/Different from baseline Arousal/Alertness: Awake/alert Orientation Level: Oriented X4 Attention: Sustained;Alternating;Divided Sustained Attention: Appears intact Alternating Attention: Appears intact Divided Attention: Appears intact Memory: Impaired Memory Impairment: Retrieval deficit (Working memory; processing multiple items) Awareness: Appears intact Problem Solving: Appears intact Executive Function: Reasoning Reasoning: Appears intact Safety/Judgment: Appears intact       Comprehension  Auditory Comprehension Overall Auditory Comprehension: Appears within functional limits for tasks assessed Visual Recognition/Discrimination Discrimination: Within Function Limits Reading Comprehension Reading Status: Within funtional limits    Expression Expression Primary Mode of Expression: Verbal Verbal Expression Overall Verbal Expression: Appears within functional limits for tasks assessed Written Expression Dominant Hand: Right   Oral / Motor  Oral Motor/Sensory Function Overall Oral Motor/Sensory Function: Mild impairment Facial Symmetry: Abnormal symmetry left Motor Speech Overall Motor Speech: Appears within functional limits for tasks assessed   GO                    Carmela Rima, Student SLP 01/11/2017, 11:07 AM

## 2017-01-11 NOTE — Progress Notes (Signed)
Occupational Therapy Treatment Patient Details Name: Virginia Grant MRN: [id number] DOB: 01/02/1976 Today's Date: 01/11/2017    History of present illness 41 y.o. female past medical history of anxiety and depression, who was in her usual state of health until Saturday, when she noticed some left lower facial drooping, and numbness in her left arm and leg. MRI + Acute infarct in the posterior limb internal capsule and deep white matter tracts on the right.   OT comments  Educated pt on HEP for LUE including fine motor and coordination activities. Educated pt on warning signs/symptoms of CVA. Pt making good progress. Will defer further OT to neruo outpt setting.   Follow Up Recommendations  Outpatient OT;Supervision - Intermittent    Equipment Recommendations  None recommended by OT    Recommendations for Other Services      Precautions / Restrictions Precautions Precautions: Fall Precaution Comments: left sided weakness Restrictions Weight Bearing Restrictions: Yes (left arm)              ADL either performed or assessed with clinical judgement   ADL      overall modified independent with basic ADL tasks                                         Vision  will further assess at outpt     Perception     Praxis      Cognition Arousal/Alertness: Awake/alert Behavior During Therapy: WFL for tasks assessed/performed Overall Cognitive Status: Within Functional Limits for tasks assessed                                 General Comments: will further assess cognition. WFL for basic ADL tasks        Exercises Exercises: Other exercises General Exercises - Lower Extremity Mini-Sqauts:  (Good form) Other Exercises Other Exercises: theraputty/fine motor/coordination activities - writtent information reviewed   Shoulder Instructions       General Comments      Pertinent Vitals/ Pain       Pain Assessment: No/denies pain  Home Living                                           Prior Functioning/Environment              Frequency  Min 2X/week        Progress Toward Goals  OT Goals(current goals can now be found in the care plan section)  Progress towards OT goals: Goals met/education completed, patient discharged from OT (continue to outpt OT)  Acute Rehab OT Goals Patient Stated Goal: to return to work and return back to functional activities including working out OT Goal Formulation: With patient Time For Goal Achievement: 01/24/17 Potential to Achieve Goals: Good ADL Goals Pt Will Perform Lower Body Dressing: with modified independence;sit to/from stand Pt Will Perform Tub/Shower Transfer: with modified independence;rolling walker;Tub transfer Pt/caregiver will Perform Home Exercise Program: Left upper extremity;With written HEP provided Additional ADL Goal #1: Pt will independently verbalize 3 stretegies to reduce risk fo falls.   Plan Discharge plan remains appropriate    Co-evaluation                 AM-PAC  PT "6 Clicks" Daily Activity     Outcome Measure   Help from another person eating meals?: None Help from another person taking care of personal grooming?: None Help from another person toileting, which includes using toliet, bedpan, or urinal?: None Help from another person bathing (including washing, rinsing, drying)?: None Help from another person to put on and taking off regular upper body clothing?: None Help from another person to put on and taking off regular lower body clothing?: None 6 Click Score: 24    End of Session    OT Visit Diagnosis: Unsteadiness on feet (R26.81);Muscle weakness (generalized) (M62.81)   Activity Tolerance Patient tolerated treatment well   Patient Left in bed;with call bell/phone within reach;with family/visitor present   Nurse Communication Mobility status        Time: 1140-1155 OT Time Calculation (min): 15  min  Charges: OT General Charges $OT Visit: 1 Visit OT Treatments $Therapeutic Activity: 8-22 mins  [facility], OT/L  [phone] 01/11/2017   [name],[name] 01/11/2017, 2:55 PM

## 2017-01-11 NOTE — Discharge Summary (Signed)
Physician Discharge Summary  Virginia Grant DOB: December 30, 1975 DOA: 01/09/2017  PCP: Sandford Craze, NP  Admit date: 01/09/2017 Discharge date: 01/11/2017  Time spent: 30 minutes  Recommendations for Outpatient Follow-up:  Patient will be discharged to home with outpatient physical, occupational, speech therapy.  Patient will need to follow up with primary care provider within one week of discharge.  Follow up with Dr. Pearlean Brownie, neurology, in 6 weeks. TEE can be done as an outpatient. Patient should continue medications as prescribed.  Patient should follow a heart healthy diet.   Discharge Diagnoses:  Acute CVA Essential hypertension Obesity Depression/anxiety  Discharge Condition: stable  Diet recommendation: heart healthy  Filed Weights   01/09/17 1202  Weight: 96.3 kg (212 lb 4.9 oz)    History of present illness:  On 01/09/17 by Ms. Marlowe Kays, PA Virginia Grant is a 41 y.o. female with medical history significant for anxiety and depression, brought from [facility] for further workup regarding strokelike symptoms. In review, the patient was in her usual state of health until about Saturday, when she noted some drooping in the left lower side of her face, without any sensory deficits. The patient thought at the time to have Bell's palsy, for which she did not seek medical attention. This morning, the patient noted slurred speech, with an resolving left facial droop, left arm numbness and tingling, and mild left lower extremity weakness. Patient  never had a similar episode. Denies any history of TIA. Denies vertigo dizziness or vision changes. Denies headaches  No confusion or seizures. Denies any chest pain, or shortness of breath. Denies any fever or chills, or night sweats. She denies any neck pain.Denies urine retention or incontinence.   No tobacco abuse. No new meds or hormonal supplements. Does not take a regular ASA a day, ot other antiplatelets or  anticoagulants.Denies any recent long distance trips or recent surgeries. No sick contacts. Denies diabetes or multiple sclerosis history. Family history of stroke in 2 aunts.1 sister with history of multiple miscarriages. Patient was not administered TPA as is beyond time window for treatment consideration. Will admit for further evaluation and treatment.  Hospital Course:  Acute CVA -CT angiogram head and neck showed 19 mm evolving acute ischemic infarct in the right internal capsule/corona radiata -MRI brain: Acute infarct in the posterior limb internal capsule and deep white matter tracts on the right -Echocardiogram: systolic function was normal, no RWMA. No defect or PFO  -LDL 77, Hemoglobin A1c 4.8 -Neurology consulted and appreciated -s/p Transcranial bubble study: Right MCA insonated, no apparent PFO, no HITS at rest or during valsalva  -Continue aspirin, statin -Speech/PT/OT recommended outpatient therapy -Will need outpatient TEE  Essential hypertension -On no home medications, BP has been stable   Obesity -BMI 33 -Patient will need a follow-up with her primary care physician to discuss lifestyle modifications  Depression/anxiety -Continue Effexor   Procedures: Echocardiogram Transcranial bubble study  Consultations: Neurology  Discharge Exam: Vitals:   01/11/17 0730 01/11/17 1008  BP: (!) 110/50 108/62  Pulse: 69 62  Resp: 15 16  Temp: 98.6 F (37 C) 98.2 F (36.8 C)  SpO2:  97%   Patient denies chest pain, shortness of breath, abdominal pain, nausea, vomiting, diarrhea, dizziness, headache.    General: Well developed, well nourished, NAD, appears stated age  HEENT: NCAT,  mucous membranes moist.  Cardiovascular: S1 S2 auscultated, RRR, no murmurs  Respiratory: Clear to auscultation bilaterally with equal chest rise  Abdomen: Soft, nontender, nondistended, +  bowel sounds  Extremities: warm dry without cyanosis clubbing or edema  Neuro: AAOx3, mild  left facial droop, slight weakness of the left  Psych: Normal affect and demeanor with intact judgement and insight  Discharge Instructions Discharge Instructions    Ambulatory referral to Occupational Therapy    Complete by:  As directed    Ambulatory referral to Physical Therapy    Complete by:  As directed    Ambulatory referral to Speech Therapy    Complete by:  As directed    Discharge instructions    Complete by:  As directed    Patient will be discharged to home with outpatient physical, occupational, speech therapy.  Patient will need to follow up with primary care provider within one week of discharge.  TEE can be done as an outpatient. Follow up with Dr. Pearlean Brownie, neurology, in 6 weeks. Patient should continue medications as prescribed.  Patient should follow a heart healthy diet.     Current Discharge Medication List    START taking these medications   Details  aspirin 325 MG EC tablet Take 1 tablet (325 mg total) by mouth daily. Qty: 30 tablet, Refills: 0    atorvastatin (LIPITOR) 10 MG tablet Take 1 tablet (10 mg total) by mouth daily at 6 PM. Qty: 30 tablet, Refills: 0      CONTINUE these medications which have NOT CHANGED   Details  ibuprofen (ADVIL,MOTRIN) 200 MG tablet Take 600 mg by mouth every 6 (six) hours as needed for headache (pain).    venlafaxine XR (EFFEXOR-XR) 150 MG 24 hr capsule Take 300 mg by mouth daily with breakfast.       No Known Allergies Follow-up Information    [facility] Follow [facility].   Specialty:  Rehabilitation Why:  they will contact you for the first appointment. If you have not received a phone call within a week please call the number above.  Contact information: [address] [id number] mc [place] [address] [phone]       Sandford Craze, NP. Schedule an appointment as soon as possible for a visit in 1 week(s).   Specialty:  Internal Medicine Why:   Hospital follow up Contact information: [address] [place] [address] [phone]        Micki Riley, MD. Schedule an appointment as soon as possible for a visit in 6 week(s).   Specialties:  Neurology, Radiology Why:  Hospital follow up, stroke clinic Contact information: [address] [place] [address] [phone]            The results of significant diagnostics from this hospitalization (including imaging, microbiology, ancillary and laboratory) are listed below for reference.    Significant Diagnostic Studies: Ct Angio Head W Or Wo Contrast  Result Date: 01/09/2017 CLINICAL DATA:  Initial evaluation for intermittent slurred speech with left-sided facial numbness in hands EXAM: CT ANGIOGRAPHY HEAD AND NECK TECHNIQUE: Multidetector CT imaging of the head and neck was performed using the standard protocol during bolus administration of intravenous contrast. Multiplanar CT image reconstructions and MIPs were obtained to evaluate the vascular anatomy. Carotid stenosis measurements (when applicable) are obtained utilizing NASCET criteria, using the distal internal carotid diameter as the denominator. CONTRAST:  50 cc of Isovue 370. COMPARISON:  Prior CT from earlier the same day. FINDINGS: CTA NECK FINDINGS Aortic arch: Visualized aortic arch of normal caliber with normal branch pattern. No flow-limiting stenosis about the origin of  the great vessels. Visualized subclavian artery is widely patent. Right carotid system: Right common and internal carotid artery's widely patent without stenosis, dissection, or occlusion. No atheromatous narrowing about the right carotid bifurcation. Left carotid system: Left common and internal carotid artery's widely patent without stenosis, dissection, or occlusion. No significant atheromatous narrowing about the left carotid bifurcation. Vertebral arteries: Both of the vertebral arteries arise from the subclavian  arteries. Vertebral artery's widely patent without stenosis, dissection, or occlusion. Skeleton: No acute osseus abnormality. No worrisome lytic or blastic osseous lesions. Other neck: No acute soft tissue abnormality within the neck. Salivary glands normal. Thyroid normal. No adenopathy. Upper chest: Visualized upper chest within normal limits. Visualized lungs are clear. Review of the MIP images confirms the above findings CTA HEAD FINDINGS Anterior circulation: The petrous, cavernous, and supraclinoid segments of the internal carotid arteries are widely patent without stenosis. ICA termini widely patent. A1 segments, anterior communicating artery common anterior cerebral arteries widely patent. M1 segments widely patent without stenosis or occlusion. MCA bifurcations normal. No proximal M2 occlusion. Distal MCA branches well perfused and symmetric. Posterior circulation: Vertebral artery's widely patent to the vertebrobasilar junction. Posterior inferior cerebral arteries patent bilaterally. Basilar artery widely patent to its distal aspect. Superior cerebellar and posterior cerebral arteries widely patent bilaterally. Venous sinuses: Patent. Anatomic variants: None significant. No aneurysm or vascular malformation. Delayed phase: No pathologic enhancement. Evolving 19 mm acute ischemic infarct noted within the right internal capsule/ corona radiata (series 13, image 17). Review of the MIP images confirms the above findings IMPRESSION: 1. Normal CTA of the head and neck. 2. 19 mm evolving acute ischemic infarct within the right internal capsule/corona radiata. Electronically Signed   By: Rise Mu M.D.   On: 01/09/2017 19:53   Ct Head Wo Contrast  Result Date: 01/09/2017 CLINICAL DATA:  Left-sided facial droop and weakness since 01/07/2017. Slurred speech with left arm and leg numbness. EXAM: CT HEAD WITHOUT CONTRAST TECHNIQUE: Contiguous axial images were obtained from the base of the skull  through the vertex without intravenous contrast. COMPARISON:  None. FINDINGS: Brain: There is low-density in the right internal capsule anterior limb, asymmetric and primarily suspicious for recent small-vessel infarct. Asymmetric periventricular white matter low density extending superiorly from the frontal horn right lateral ventricle, without mass effect. No hemorrhage or hydrocephalus. Vascular: No hyperdense vessel or unexpected calcification. Skull: Negative Sinuses/Orbits: Negative Other: These results will be called to the ordering clinician or representative by the Radiologist Assistant, and communication documented in the PACS or zVision Dashboard. IMPRESSION: Recent infarct or other asymmetric abnormality in the anterior limb right internal capsule. Recommend MRI characterization. Electronically Signed   By: Marnee Spring M.D.   On: 01/09/2017 11:38   Ct Angio Neck W Or Wo Contrast  Result Date: 01/09/2017 CLINICAL DATA:  Initial evaluation for intermittent slurred speech with left-sided facial numbness in hands EXAM: CT ANGIOGRAPHY HEAD AND NECK TECHNIQUE: Multidetector CT imaging of the head and neck was performed using the standard protocol during bolus administration of intravenous contrast. Multiplanar CT image reconstructions and MIPs were obtained to evaluate the vascular anatomy. Carotid stenosis measurements (when applicable) are obtained utilizing NASCET criteria, using the distal internal carotid diameter as the denominator. CONTRAST:  50 cc of Isovue 370. COMPARISON:  Prior CT from earlier the same day. FINDINGS: CTA NECK FINDINGS Aortic arch: Visualized aortic arch of normal caliber with normal branch pattern. No flow-limiting stenosis about the origin of the great vessels. Visualized subclavian artery is widely patent.  Right carotid system: Right common and internal carotid artery's widely patent without stenosis, dissection, or occlusion. No atheromatous narrowing about the right  carotid bifurcation. Left carotid system: Left common and internal carotid artery's widely patent without stenosis, dissection, or occlusion. No significant atheromatous narrowing about the left carotid bifurcation. Vertebral arteries: Both of the vertebral arteries arise from the subclavian arteries. Vertebral artery's widely patent without stenosis, dissection, or occlusion. Skeleton: No acute osseus abnormality. No worrisome lytic or blastic osseous lesions. Other neck: No acute soft tissue abnormality within the neck. Salivary glands normal. Thyroid normal. No adenopathy. Upper chest: Visualized upper chest within normal limits. Visualized lungs are clear. Review of the MIP images confirms the above findings CTA HEAD FINDINGS Anterior circulation: The petrous, cavernous, and supraclinoid segments of the internal carotid arteries are widely patent without stenosis. ICA termini widely patent. A1 segments, anterior communicating artery common anterior cerebral arteries widely patent. M1 segments widely patent without stenosis or occlusion. MCA bifurcations normal. No proximal M2 occlusion. Distal MCA branches well perfused and symmetric. Posterior circulation: Vertebral artery's widely patent to the vertebrobasilar junction. Posterior inferior cerebral arteries patent bilaterally. Basilar artery widely patent to its distal aspect. Superior cerebellar and posterior cerebral arteries widely patent bilaterally. Venous sinuses: Patent. Anatomic variants: None significant. No aneurysm or vascular malformation. Delayed phase: No pathologic enhancement. Evolving 19 mm acute ischemic infarct noted within the right internal capsule/ corona radiata (series 13, image 17). Review of the MIP images confirms the above findings IMPRESSION: 1. Normal CTA of the head and neck. 2. 19 mm evolving acute ischemic infarct within the right internal capsule/corona radiata. Electronically Signed   By: Rise Mu M.D.   On:  01/09/2017 19:53   Mr Laqueta Jean ZO Contrast  Result Date: 01/10/2017 CLINICAL DATA:  Demyelinating disease without cord symptoms. Focal neurologic deficit. Slurred speech left-sided numbness and weakness EXAM: MRI HEAD WITHOUT AND WITH CONTRAST TECHNIQUE: Multiplanar, multiecho pulse sequences of the brain and surrounding structures were obtained without and with intravenous contrast. CONTRAST:  20 mL MultiHance IV COMPARISON:  CTA 01/09/2017 FINDINGS: Brain: Rounded area of restricted diffusion in the posterior limb internal capsule and deep white matter tracts on the right measuring 2 cm. This does not enhance and is most consistent with acute infarct. Additionally, there is hyperintensity in the corona radiata and deep white matter on the right compatible with chronic infarct. No left-sided lesions identified. Brainstem normal. Negative for hemorrhage or mass. No enhancing lesions identified. Ventricle size normal without midline shift. Vascular: Normal arterial flow voids. Skull and upper cervical spine: Negative Sinuses/Orbits: Negative Other: None IMPRESSION: Acute infarct in the posterior limb internal capsule and deep white matter tracts on the right. Additional smaller area of chronic infarct in the deep white matter on the right. No other signs of demyelinating disease identified. Electronically Signed   By: Marlan Palau M.D.   On: 01/10/2017 11:06    Microbiology: No results found for this or any previous visit (from the past 240 hour(s)).   Labs: Basic Metabolic Panel:  Recent Labs Lab 01/09/17 1228 01/10/17 0648 01/11/17 0719  NA 138 139 137  K 3.7 3.9 4.1  CL 107 109 107  CO2 GLUCOSE 111* 92 113*  BUN CREATININE 0.85 0.92 0.83  CALCIUM 8.8* 8.2* 8.7*   Liver Function Tests: No results for input(s): AST, ALT, ALKPHOS, BILITOT, PROT, ALBUMIN in the last 168 hours. No results for input(s): LIPASE, AMYLASE in the  last 168 hours. No results for input(s):  AMMONIA in the last 168 hours. CBC:  Recent Labs Lab 01/09/17 1228 01/10/17 0648 01/11/17 0644  WBC 7.1 6.8 7.8  NEUTROABS 5.0  --   --   HGB 13.3 12.4 12.6  HCT 38.6 37.8 38.3  MCV 93.2 93.1 93.0  PLT 251 212 232   Cardiac Enzymes: No results for input(s): CKTOTAL, CKMB, CKMBINDEX, TROPONINI in the last 168 hours. BNP: BNP (last 3 results) No results for input(s): BNP in the last 8760 hours.  ProBNP (last 3 results) No results for input(s): PROBNP in the last 8760 hours.  CBG: No results for input(s): GLUCAP in the last 168 hours.     SignedEdsel Petrin  [facility] 01/11/2017, 1:52 PM

## 2017-01-11 NOTE — Care Management Note (Signed)
Case Management Note  Patient Details  Name: Virginia Grant MRN: [id number] Date of Birth: 20-Mar-1976  Subjective/Objective:                    Action/Plan: Pt discharging home with recommendations for outpatient therapy. CM consulted and met with the patient to go over outpatient therapy. Pt would like to attend at [facility]. Orders in EPIC and information on the AVS.  Pt has transportation home.   Expected Discharge Date:  01/13/17               Expected Discharge Plan:  OP Rehab  In-House Referral:     Discharge planning Services  CM Consult  Post Acute Care Choice:    Choice offered to:     DME Arranged:    DME Agency:     HH Arranged:    HH Agency:     Status of Service:  Completed, signed off  If discussed at [employer] of Stay Meetings, dates discussed:    Additional Comments:  Pollie Friar, RN 01/11/2017, 10:07 AM

## 2017-01-13 LAB — ANTIPHOSPHOLIPID SYNDROME EVAL, BLD
Anticardiolipin IgG: <9 GPL U/mL (ref 0–14)
DRVVT: 26 s (ref 0.0–47.0)
PHOSPHATYDALSERINE, IGA: 1 {APS'U} (ref 0–20)
PHOSPHATYDALSERINE, IGM: 10 {MPS'U} (ref 0–25)
PTT Lupus Anticoagulant: 29.3 s (ref 0.0–51.9)
Phosphatydalserine, IgG: 1 GPS IgG (ref 0–11)

## 2017-01-15 NOTE — ED Provider Notes (Signed)
[facility] Provider Note   CSN: [id number] Arrival date & time: 01/09/17  1156     History   Chief Complaint Chief Complaint  Patient presents with  . Cerebrovascular Accident    HPI Virginia Grant is a 41 y.o. female.  HPI 41 y.o. female with medical history significant for anxiety and depression comes to the ER with cc of slurred speech and facial droop. On Saturday, 2 days ago pt noted some slurring of speech and weakness in her extremities. She then noted some drooping in the left lower side of her face, without any sensory deficits. The patient thought at the time to have Bell's palsy, for which she did not seek medical attention. This morning, the patient noted slurred speech again along with L arm tingling so she came to the ER.   Past Medical History:  Diagnosis Date  . Anxiety   . Depression   . Hypertension     Patient Active Problem List   Diagnosis Date Noted  . Acute CVA (cerebrovascular accident) (HCC) 01/10/2017  . Stroke (HCC) 01/10/2017  . Stroke (cerebrum) (HCC) 01/09/2017  . Anxiety state 01/09/2017  . Depression 01/09/2017  . Stroke-like symptoms   . Depression with anxiety   . Routine general medical examination at a health care facility 07/15/2013  . Need for prophylactic vaccination with combined diphtheria-tetanus-pertussis (DTP) vaccine 07/15/2013  . Other malaise and fatigue 07/15/2013  . Essential hypertension, benign 07/14/2013  . Obesity 07/14/2013    Past Surgical History:  Procedure Laterality Date  . ESSURE TUBAL LIGATION    . NO PAST SURGERIES      OB History    No data available       Home Medications    Prior to Admission medications   Medication Sig Start Date End Date Taking? Authorizing Provider  ibuprofen (ADVIL,MOTRIN) 200 MG tablet Take 600 mg by mouth every 6 (six) hours as needed for headache (pain).   Yes [provider]  venlafaxine XR (EFFEXOR-XR) 150 MG 24 hr capsule Take 300 mg by  mouth daily with breakfast.   Yes [provider]  aspirin 325 MG EC tablet Take 1 tablet (325 mg total) by mouth daily. 01/12/17   Mikhail, Nita Sells, DO  atorvastatin (LIPITOR) 10 MG tablet Take 1 tablet (10 mg total) by mouth daily at 6 PM. 01/11/17   Edsel Petrin, DO    Family History Family History  Problem Relation Age of Onset  . Hypertension Mother   . Diabetes Father   . Hypertension Father   . Cancer Maternal Aunt        Colon  . Cancer Maternal Uncle        Colon   . Heart attack Maternal Uncle   . COPD Maternal Grandmother   . Heart disease Maternal Grandmother   . Hypertension Brother     Social History Social History  Substance Use Topics  . Smoking status: Former Smoker    Packs/day: 1.00    Years: 5.00    Types: Cigarettes    Quit date: 04/26/1995  . Smokeless tobacco: Never Used  . Alcohol use Yes     Comment: Rarely     Allergies   Patient has no known allergies.   Review of Systems Review of Systems  Constitutional: Positive for activity change.  Respiratory: Negative for shortness of breath.   Cardiovascular: Negative for chest pain.  Neurological: Positive for facial asymmetry, speech difficulty, weakness and numbness. Negative for headaches.  All  other systems reviewed and are negative.    Physical Exam Updated Vital Signs BP 116/71 (BP Location: Left Arm)   Pulse 70   Temp 98 F (36.7 C) (Oral)   Resp 17   Ht  (1.702 m)   Wt 96.3 kg (212 lb 4.9 oz)   LMP 01/09/2017   SpO2 96%   BMI 33.25 kg/m   Physical Exam  Constitutional: She is oriented to person, place, and time. She appears well-developed.  HENT:  Head: Normocephalic and atraumatic.  Eyes: EOM are normal.  Neck: Normal range of motion. Neck supple.  Cardiovascular: Normal rate.   Pulmonary/Chest: Effort normal.  Abdominal: Bowel sounds are normal.  Neurological: She is alert and oriented to person, place, and time.  L facial droop and slight upper and  lower extremity weakness. PT has no slurring of speech on my evaluation Pt is having numbness to her L side.  Skin: Skin is warm and dry.  Nursing note and vitals reviewed.    ED Treatments / Results  Labs (all labs ordered are listed, but only abnormal results are displayed) Labs Reviewed  BASIC METABOLIC PANEL - Abnormal; Notable for the following:       Result Value   Glucose, Bld 111 (*)    Calcium 8.8 (*)    All other components within normal limits  BASIC METABOLIC PANEL - Abnormal; Notable for the following:    Calcium 8.2 (*)    All other components within normal limits  BASIC METABOLIC PANEL - Abnormal; Notable for the following:    Glucose, Bld 113 (*)    Calcium 8.7 (*)    All other components within normal limits  CBC  PROTIME-INR  APTT  DIFFERENTIAL  HEMOGLOBIN A1C  LIPID PANEL  CBC  ANTIPHOSPHOLIPID SYNDROME EVAL, BLD  SEDIMENTATION RATE  ANA W/REFLEX IF POSITIVE  CBC    EKG  EKG Interpretation  Date/Time:  Monday January 09 2017 12:08:40 EDT Ventricular Rate:  63 PR Interval:    QRS Duration: 92 QT Interval:  428 QTC Calculation: 439 R Axis:   73 Text Interpretation:  Sinus rhythm Probable left atrial enlargement Confirmed by Nicanor Alcon, April ([id number]) on 01/10/2017 1:15:03 PM       Radiology No results found.  Procedures Procedures (including critical care time)  Medications Ordered in ED Medications   stroke: mapping our early stages of recovery book ( Does not apply Given 01/09/17 1817)  iopamidol (ISOVUE-370) 76 % injection (50 mLs  Contrast Given 01/09/17 1843)  gadobenate dimeglumine (MULTIHANCE) injection 20 mL (20 mLs Intravenous Contrast Given 01/10/17 1020)     Initial Impression / Assessment and Plan / ED Course  I have reviewed the triage vital signs and the nursing notes.  Pertinent labs & imaging results that were available during my care of the patient were reviewed by me and considered in my medical decision making (see  chart for details).     Pt likely has a subacute stroke. CT shows R sided infarct. PT is young and healthy person overall.  Neuro accepting.  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

## 2017-01-16 ENCOUNTER — Encounter: Payer: Self-pay | Admitting: Family

## 2017-01-16 ENCOUNTER — Ambulatory Visit (INDEPENDENT_AMBULATORY_CARE_PROVIDER_SITE_OTHER): Payer: BC Managed Care – PPO | Admitting: Family

## 2017-01-16 DIAGNOSIS — I6302 Cerebral infarction due to thrombosis of basilar artery: Secondary | ICD-10-CM

## 2017-01-16 NOTE — Progress Notes (Signed)
Subjective:    Patient ID: Virginia Grant, female    DOB: April 11, 1976, 41 y.o.   MRN: [id number]  HPI  Virginia Grant is a 41 yr old female who presents today for hospital follow up.I initially saw the patient on 01/09/2017. At that time she had a 2 day history of left facial drooping and left-sided weakness/numbness. CT of the head was performed  which noted areas suspicious for infarct in the right internal terminal capsule, anterior limb. She was sent to the emergency department for further evaluation and MRI. The patient was admitted. Neurology was consulted.  MRI was consistent with an acute infarct in the posterior limb of the internal capsule. Also involved was the area of deep white matter on the right. Additional smaller areas of chronic infarct in the deep white matter on the right were noted. There were no obvious signs of demyelinating disease noted. CT angiogram of the neck was normal. 2-D echo was performed. Echo appeared normal without evidence of patent foramen ovale. Reports that her left hand lipid panel looked good however she was placed on statin for secondary stroke prevention. She was also placed on a full dose aspirin 325 mg once daily. Lab work was largely unremarkable including negative ANAs, normal sedimentation rate and normal antiphospholipid testing. A1c was within normal limits. She is a nonsmoker and she is not on any hormonal contraception.  She reports that at time of discharge a referral was made for home health physical therapy occupational therapy and speech therapy. She currently does not have any restrictions on her diet or thickness of liquids. She is scheduled to follow-up with her neurologist tomorrow. Plan is for an outpatient TEE. She has not yet heard from therapy. She reports that since returning home she has not had any issues with word finding. She does feel as though her left hand weakness is a bit more pronounced. She is eager to begin occupational therapy. She  feels that she fatigues quite easily and is not ready to return to work at this time. She continues to drag her left foot a bit when she walks.Has mild numbness left side of tongue, hand, thumb, and back of left leg.    Reports that his dad has carotid stenosis.  Denies family hx of blood clot.  Cousin had stroke in his 63's. Reports that he was a severe alcoholic.  Review of Systems See HPI  Past Medical History:  Diagnosis Date  . Anxiety   . Depression   . Hypertension      Social History   Social History  . Marital status: Married    Spouse name: [name]   . Number of children: 1  . Years of education: 51   Occupational History  .  [employer]   Social History Main Topics  . Smoking status: Former Smoker    Packs/day: 1.00    Years: 5.00    Types: Cigarettes    Quit date: 04/26/1995  . Smokeless tobacco: Never Used  . Alcohol use Yes     Comment: Rarely  . Drug use: No  . Sexual activity: Yes    Partners: Male   Other Topics Concern  . Not on file   Social History Narrative   Marital Status:  Married [name])    Children:  Son [name]) born 2000   Pets: Dogs 2   Living Situation: Lives with husband and son.   Occupation:  Biomedical engineer:  Bachelor's Degree    Tobacco Use:  She smoked 1 ppd for about 5 years.  She quit in 1997.     Alcohol Use:  Rarely   Drug Use:  None   Diet:  Regular   Exercise:  Limited; She was exercising up until December but has gotten off track. She was walking/running and was going to the [facility].     Hobbies:  Family                 Past Surgical History:  Procedure Laterality Date  . ESSURE TUBAL LIGATION    . NO PAST SURGERIES      Family History  Problem Relation Age of Onset  . Hypertension Mother   . Diabetes Father   . Hypertension Father   . Cancer Maternal Aunt        Colon  . Cancer Maternal Uncle        Colon   . Heart attack Maternal Uncle   . COPD Maternal  Grandmother   . Heart disease Maternal Grandmother   . Hypertension Brother     No Known Allergies  Current Outpatient Prescriptions on File Prior to Visit  Medication Sig Dispense Refill  . aspirin 325 MG EC tablet Take 1 tablet (325 mg total) by mouth daily. 30 tablet 0  . atorvastatin (LIPITOR) 10 MG tablet Take 1 tablet (10 mg total) by mouth daily at 6 PM. 30 tablet 0  . ibuprofen (ADVIL,MOTRIN) 200 MG tablet Take 600 mg by mouth every 6 (six) hours as needed for headache (pain).    Marland Kitchen venlafaxine XR (EFFEXOR-XR) 150 MG 24 hr capsule Take 300 mg by mouth daily with breakfast.     No current facility-administered medications on file prior to visit.     BP 111/76 (BP Location: Left Arm, Cuff Size: Large)   Pulse 80   Temp 98 F (36.7 C) (Oral)   Resp 16   Ht 5' 6.5" (1.689 m)   Wt 209 lb (94.8 kg)   LMP 01/09/2017   SpO2 100%   BMI 33.23 kg/m       Objective:   Physical Exam  Constitutional: She is oriented to person, place, and time. She appears well-developed and well-nourished.  HENT:  Head: Normocephalic and atraumatic.  Eyes: EOM are normal.  Cardiovascular: Normal rate, regular rhythm and normal heart sounds.   No murmur heard. Pulmonary/Chest: Effort normal and breath sounds normal. No respiratory distress. She has no wheezes.  Neurological: She is alert and oriented to person, place, and time.  Left upper extrem strength is 4-5/5 Right upper extremity strength is 5/5 Bilateral lower extremity strength appears to be 5/ 5 Left-sided facial weakness is noted. Tongue is midline. Speech is clear. Pupils equal round reactive to light and accommodation.  Psychiatric: She has a normal mood and affect. Her behavior is normal. Judgment and thought content normal.          Assessment & Plan:

## 2017-01-16 NOTE — Patient Instructions (Signed)
Please let me know if you have not heard back this week about scheduling PT/OT/Speech Therapy. Continue lipitor and aspirin. Follow up in 1 month prior to your return to work date of 02/15/17.

## 2017-01-16 NOTE — Assessment & Plan Note (Signed)
Advised patient to continue aspirin and statin for secondary stroke prevention. She is advised to keep her upcoming appointment with neurology tomorrow. I've also asked her to let me know if she has not heard back about scheduling her PT OT and speech therapy this week. I've advised her to remain out of work for the next 1 month. Will fill FMLA paperwork to support this absence. I do not feel that she can safely return to work right now until she is a bit stronger and her endurance is improved.

## 2017-01-17 ENCOUNTER — Encounter: Payer: Self-pay | Admitting: Cardiology

## 2017-01-17 ENCOUNTER — Ambulatory Visit (INDEPENDENT_AMBULATORY_CARE_PROVIDER_SITE_OTHER): Payer: BC Managed Care – PPO | Admitting: Cardiology

## 2017-01-17 VITALS — BP 108/64 | HR 73 | Ht 66.0 in | Wt 209.1 lb

## 2017-01-17 DIAGNOSIS — I639 Cerebral infarction, unspecified: Secondary | ICD-10-CM

## 2017-01-17 NOTE — Addendum Note (Signed)
Addended by: Craige Cotta on: 01/17/2017 03:17 PM   Modules accepted: Orders

## 2017-01-17 NOTE — Patient Instructions (Addendum)
Medication Instructions:  None  Labwork: None  Testing/Procedures: Your physician has requested that you have a TEE. During a TEE, sound waves are used to create images of your heart. It provides your doctor with information about the size and shape of your heart and how well your heart's chambers and valves are working. In this test, a transducer is attached to the end of a flexible tube that's guided down your throat and into your esophagus (the tube leading from you mouth to your stomach) to get a more detailed image of your heart. You are not awake for the procedure. Please see the instruction sheet given to you today. For further information please visit [email or web address]  TEE is scheduled for October 4th for 1:00pm at [facility]. Please arrive 1 and 1/2 hour early for this appointment.    Follow-Up: 6 months  Any Other Special Instructions Will Be Listed Below (If Applicable).     If you need a refill on your cardiac medications before your next appointment, please call your pharmacy.

## 2017-01-17 NOTE — Progress Notes (Signed)
Cardiology Office Note:    Date:  01/17/2017   ID:  Virginia Grant, DOB 17-Jul-1975, MRN [id number]  PCP:  Sandford Craze, NP  Cardiologist:  Garwin Brothers, MD   Referring MD: Sandford Craze, NP    ASSESSMENT:    1. Acute CVA (cerebrovascular accident) (HCC)   2. Cerebrovascular accident (CVA), unspecified mechanism (HCC)    PLAN:    In order of problems listed above:  1. I discussed my findings with the patient at extensive length. She does not have much from a cardiovascular standpoint. Her echocardiogram was unremarkable. I agree with the fact that she needs to be evaluated to complete her workup from a cardiovascular standpoint. She will undergo transesophageal echocardiography and we will set it up partners. This will be predominantly looking for interatrial septal defect and the possibility of any pathology in the left atrial appendage.She will be seen in follow-up appointment in 6 months or earlier if she has any concerns.   Medication Adjustments/Labs and Tests Ordered: Current medicines are reviewed at length with the patient today.  Concerns regarding medicines are outlined above.  Orders Placed This Encounter  Procedures  . ECHO TEE   No orders of the defined types were placed in this encounter.    History of Present Illness:    Virginia Grant is a 41 y.o. female who is being seen today for the evaluation of stroke and transesophageal echocardiogram at the request of Sandford Craze, NP. Patient is a pleasant 41 year old female. She has no significant past medical history. She mentions to me that she was diagnosed with essential hypertension many years ago but with exercise and diet and lifestyle modification her medications were discontinued. Unfortunately recently she had a stroke and underwent complete evaluation at the hospital and is currently discharged. She has residual weakness for which she is taking therapy at this time. She is here for  evaluation for transesophageal echocardiogram as a part of complete evaluation of stroke. According to the notes by the neurologist her other evaluations have been unremarkable. She denies any chest pain orthopnea or PND.  Past Medical History:  Diagnosis Date  . Anxiety   . Depression   . Hypertension     Past Surgical History:  Procedure Laterality Date  . ESSURE TUBAL LIGATION    . NO PAST SURGERIES      Current Medications: Current Meds  Medication Sig  . aspirin 325 MG EC tablet Take 1 tablet (325 mg total) by mouth daily.  Marland Kitchen atorvastatin (LIPITOR) 10 MG tablet Take 1 tablet (10 mg total) by mouth daily at 6 PM.  . ibuprofen (ADVIL,MOTRIN) 200 MG tablet Take 600 mg by mouth every 6 (six) hours as needed for headache (pain).  Marland Kitchen venlafaxine XR (EFFEXOR-XR) 150 MG 24 hr capsule Take 300 mg by mouth daily with breakfast.     Allergies:   Patient has no known allergies.   Social History   Social History  . Marital status: Married    Spouse name: Virginia Grant   . Number of children: 1  . Years of education: 74   Occupational History  .  [employer]   Social History Main Topics  . Smoking status: Former Smoker    Packs/day: 1.00    Years: 5.00    Types: Cigarettes    Quit date: 04/26/1995  . Smokeless tobacco: Never Used  . Alcohol use Yes     Comment: Rarely  . Drug use: No  . Sexual activity: Yes  Partners: Male   Other Topics Concern  . None   Social History Narrative   Marital Status:  Married Virginia Grant)    Children:  Son [name]) born 2000   Pets: Dogs 2   Living Situation: Lives with husband and son.   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  She quit in 1997.     Alcohol Use:  Rarely   Drug Use:  None   Diet:  Regular   Exercise:  Limited; She was exercising up until December but has gotten off track. She was walking/running and was going to the [facility].      Hobbies:  Family                  Family History: The patient's family history includes COPD in her maternal grandmother; Cancer in her maternal aunt and maternal uncle; Diabetes in her father; Heart attack in her maternal uncle; Heart disease in her maternal grandmother; Hypertension in her brother, father, and mother.  ROS:   Please see the history of present illness.    All other systems reviewed and are negative.  EKGs/Labs/Other Studies Reviewed:    The following studies were reviewed today: Reviewed the hospital records and EKG and discussed with the patient at extensive length and questions were answered to her satisfaction. I also reviewed the echocardiogram.   Recent Labs: 01/11/2017: BUN 9; Creatinine, Ser 0.83; Hemoglobin 12.6; Platelets 232; Potassium 4.1; Sodium 137  Recent Lipid Panel    Component Value Date/Time   CHOL 148 01/10/2017 0258   TRIG 94 01/10/2017 0258   HDL 52 01/10/2017 0258   CHOLHDL 2.8 01/10/2017 0258   VLDL 19 01/10/2017 0258   LDLCALC 77 01/10/2017 0258    Physical Exam:    VS:  BP 108/64   Pulse 73   Ht  (1.676 m)   Wt 209 lb 1.3 oz (94.8 kg)   LMP 01/09/2017   SpO2 98%   BMI 33.75 kg/m     Wt Readings from Last 3 Encounters:  01/17/17 209 lb 1.3 oz (94.8 kg)  01/16/17 209 lb (94.8 kg)  01/09/17 212 lb 4.9 oz (96.3 kg)     GEN: Patient is in no acute distress HEENT: Normal NECK: No JVD; No carotid bruits LYMPHATICS: No lymphadenopathy CARDIAC: S1 S2 regular, 2/6 systolic murmur at the apex. RESPIRATORY:  Clear to auscultation without rales, wheezing or rhonchi  ABDOMEN: Soft, non-tender, non-distended MUSCULOSKELETAL:  No edema; No deformity  SKIN: Warm and dry NEUROLOGIC:  Alert and oriented x 3 PSYCHIATRIC:  Normal affect    Signed, Garwin Brothers, MD  01/17/2017 2:43 PM    [facility]

## 2017-01-26 ENCOUNTER — Ambulatory Visit: Payer: BC Managed Care – PPO | Admitting: Occupational Therapy

## 2017-01-26 ENCOUNTER — Ambulatory Visit: Payer: BC Managed Care – PPO | Admitting: Physical Therapy

## 2017-01-26 ENCOUNTER — Ambulatory Visit (HOSPITAL_COMMUNITY)
Admission: RE | Admit: 2017-01-26 | Discharge: 2017-01-26 | Disposition: A | Discharge status: Home / Self Care | Payer: BC Managed Care – PPO | Source: Ambulatory Visit | Attending: Cardiology | Admitting: Cardiology

## 2017-01-26 ENCOUNTER — Encounter (HOSPITAL_COMMUNITY): Payer: Self-pay

## 2017-01-26 ENCOUNTER — Ambulatory Visit (HOSPITAL_BASED_OUTPATIENT_CLINIC_OR_DEPARTMENT_OTHER)
Admission: RE | Admit: 2017-01-26 | Discharge: 2017-01-26 | Disposition: A | Discharge status: Home / Self Care | Payer: BC Managed Care – PPO | Source: Ambulatory Visit | Attending: Cardiology | Admitting: Cardiology

## 2017-01-26 ENCOUNTER — Encounter (HOSPITAL_COMMUNITY)
Admission: RE | Disposition: A | Discharge status: Home / Self Care | Payer: Self-pay | Source: Ambulatory Visit | Attending: Cardiology

## 2017-01-26 DIAGNOSIS — I10 Essential (primary) hypertension: Secondary | ICD-10-CM | POA: Insufficient documentation

## 2017-01-26 DIAGNOSIS — Z6833 Body mass index (BMI) 33.0-33.9, adult: Secondary | ICD-10-CM | POA: Diagnosis not present

## 2017-01-26 DIAGNOSIS — I6389 Other cerebral infarction: Secondary | ICD-10-CM | POA: Diagnosis not present

## 2017-01-26 DIAGNOSIS — G459 Transient cerebral ischemic attack, unspecified: Secondary | ICD-10-CM | POA: Diagnosis present

## 2017-01-26 DIAGNOSIS — F418 Other specified anxiety disorders: Secondary | ICD-10-CM | POA: Diagnosis not present

## 2017-01-26 DIAGNOSIS — E669 Obesity, unspecified: Secondary | ICD-10-CM | POA: Diagnosis not present

## 2017-01-26 HISTORY — PX: TEE WITHOUT CARDIOVERSION: SHX5443

## 2017-01-26 SURGERY — ECHOCARDIOGRAM, TRANSESOPHAGEAL
Anesthesia: Moderate Sedation

## 2017-01-26 MED ORDER — SODIUM CHLORIDE 0.9 % IV SOLN
INTRAVENOUS | Status: DC
  Administered 2017-01-26: 13:00:00 via INTRAVENOUS

## 2017-01-26 MED ORDER — MIDAZOLAM HCL 10 MG/2ML IJ SOLN
INTRAMUSCULAR | Status: DC | PRN
  Administered 2017-01-26 (×3): 2 mg via INTRAVENOUS
  Administered 2017-01-26: 1 mg via INTRAVENOUS

## 2017-01-26 MED ORDER — MIDAZOLAM HCL 5 MG/ML IJ SOLN
INTRAMUSCULAR | Status: AC
  Filled 2017-01-26: qty 2

## 2017-01-26 MED ORDER — BUTAMBEN-TETRACAINE-BENZOCAINE 2-2-14 % EX AERO
INHALATION_SPRAY | CUTANEOUS | Status: DC | PRN
  Administered 2017-01-26: 2 via TOPICAL

## 2017-01-26 MED ORDER — FENTANYL CITRATE (PF) 100 MCG/2ML IJ SOLN
INTRAMUSCULAR | Status: DC | PRN
  Administered 2017-01-26 (×3): 25 ug via INTRAVENOUS

## 2017-01-26 MED ORDER — FENTANYL CITRATE (PF) 100 MCG/2ML IJ SOLN
INTRAMUSCULAR | Status: AC
  Filled 2017-01-26: qty 2

## 2017-01-26 NOTE — H&P (Signed)
Discharge Summaries Date of Service: 01/11/2017 12:14 PM Virginia Petrin, DO  Internal Medicine  Expand All Collapse All   Hide copied text  Physician Discharge Summary  Virginia Grant VWU:[id number] DOB: [birth year] DOA: 01/09/2017  PCP: Sandford Craze, NP  Admit date: 01/09/2017 Discharge date: 01/11/2017  Time spent: 30 minutes  Recommendations for Outpatient Follow-up:  Patient will be discharged to home with outpatient physical, occupational, speech therapy.  Patient will need to follow up with primary care provider within one week of discharge.  Follow up with Dr. Pearlean Brownie, neurology, in 6 weeks. TEE can be done as an outpatient. Patient should continue medications as prescribed.  Patient should follow a heart healthy diet.   Discharge Diagnoses:  Acute CVA Essential hypertension Obesity Depression/anxiety  Discharge Condition: stable  Diet recommendation: heart healthy     Filed Weights   01/09/17 1202  Weight: 96.3 kg (212 lb 4.9 oz)    History of present illness:  On 01/09/17 by Ms. Marlowe Kays, PA [name] a 41 y.o.femalewith medical history significant for anxiety and depression, brought from [facility] for further workup regarding strokelike symptoms. In review, the patient was in her usual state of health until about Saturday, when she noted some drooping in the left lower side of her face, without any sensory deficits. The patient thought at the time to have Bell's palsy, for which she did not seek medical attention. This morning, the patient noted slurred speech, with an resolving left facial droop, left arm numbness and tingling, and mild left lower extremity weakness. Patient never had a similar episode. Denies any history of TIA. Denies vertigo dizziness or vision changes. Denies headaches No confusion or seizures. Denies any chest pain, or shortness of breath. Denies any fever or chills, or night sweats.She denies any neck  pain.Denies urine retention or incontinence. No tobacco abuse. No new meds or hormonal supplements. Does nottake a regular ASA a day, ot other antiplatelets or anticoagulants.Denies any recent long distance trips or recent surgeries. No sick contacts. Denies diabetes or multiple sclerosishistory. Family history of stroke in 2 aunts.1 sister with history of multiple miscarriages.Patient was not administered TPA as is beyond time window for treatment consideration. Will admit for further evaluation and treatment.  Hospital Course:  Acute CVA -CT angiogram head and neck showed 19 mm evolving acute ischemic infarct in the right internal capsule/corona radiata -MRI brain: Acute infarct in the posterior limb internal capsule and deep white matter tracts on the right -Echocardiogram: systolic function was normal, no RWMA. No defect or PFO  -LDL 77, Hemoglobin A1c 4.8 -Neurology consulted and appreciated -s/p Transcranial bubble study: Right MCA insonated, no apparent PFO, no HITS at rest or during valsalva  -Continue aspirin, statin -Speech/PT/OT recommended outpatient therapy -Will need outpatient TEE  Essential hypertension -On no home medications, BP has been stable   Obesity -BMI 33 -Patient will need a follow-up with her primary care physician to discuss lifestyle modifications  Depression/anxiety -Continue Effexor   Procedures: Echocardiogram Transcranial bubble study  Consultations: Neurology  Discharge Exam:     Vitals:   01/11/17 0730 01/11/17 1008  BP: (!) 110/50 108/62  Pulse: 69 62  Resp: 15 16  Temp: 98.6 F (37 C) 98.2 F (36.8 C)  SpO2:  97%   Patient denies chest pain, shortness of breath, abdominal pain, nausea, vomiting, diarrhea, dizziness, headache.    General: Well developed, well nourished, NAD, appears stated age  HEENT: NCAT,  mucous membranes moist.  Cardiovascular: S1  S2 auscultated, RRR, no murmurs  Respiratory: Clear to  auscultation bilaterally with equal chest rise  Abdomen: Soft, nontender, nondistended, + bowel sounds  Extremities: warm dry without cyanosis clubbing or edema  Neuro: AAOx3, mild left facial droop, slight weakness of the left  Psych: Normal affect and demeanor with intact judgement and insight  Discharge Instructions     Discharge Instructions    Ambulatory referral to Occupational Therapy    Complete by:  As directed    Ambulatory referral to Physical Therapy    Complete by:  As directed    Ambulatory referral to Speech Therapy    Complete by:  As directed    Discharge instructions    Complete by:  As directed    Patient will be discharged to home with outpatient physical, occupational, speech therapy.  Patient will need to follow up with primary care provider within one week of discharge.  TEE can be done as an outpatient. Follow up with Dr. Pearlean Brownie, neurology, in 6 weeks. Patient should continue medications as prescribed.  Patient should follow a heart healthy diet.        Current Discharge Medication List       START taking these medications   Details  aspirin 325 MG EC tablet Take 1 tablet (325 mg total) by mouth daily. Qty: 30 tablet, Refills: 0    atorvastatin (LIPITOR) 10 MG tablet Take 1 tablet (10 mg total) by mouth daily at 6 PM. Qty: 30 tablet, Refills: 0         CONTINUE these medications which have NOT CHANGED   Details  ibuprofen (ADVIL,MOTRIN) 200 MG tablet Take 600 mg by mouth every 6 (six) hours as needed for headache (pain).    venlafaxine XR (EFFEXOR-XR) 150 MG 24 hr capsule Take 300 mg by mouth daily with breakfast.       No Known Allergies    Follow-up Information    [facility].   Specialty:  Rehabilitation Why:  they will contact you for the first appointment. If you have not received a phone call within a week please call the number above.  Contact information: [address] [id number] mc [place] [address] [phone]       Sandford Craze, NP. Schedule an appointment as soon as possible for a visit in 1 week(s).   Specialty:  Internal Medicine Why:  Hospital follow up Contact information: [address] [place] [address] [phone]        Micki Riley, MD. Schedule an appointment as soon as possible for a visit in 6 week(s).   Specialties:  Neurology, Radiology Why:  Hospital follow up, stroke clinic Contact information: [address] [place] [address] [phone]             The results of significant diagnostics from this hospitalization (including imaging, microbiology, ancillary and laboratory) are listed below for reference.    Significant Diagnostic Studies:  Imaging Results  Ct Angio Head W Or Wo Contrast  Result Date: 01/09/2017 CLINICAL DATA:  Initial evaluation for intermittent slurred speech with left-sided facial numbness in hands EXAM: CT ANGIOGRAPHY HEAD AND NECK TECHNIQUE: Multidetector CT imaging of the head and neck was performed using the standard protocol during bolus administration of intravenous contrast. Multiplanar CT image reconstructions and MIPs were obtained to evaluate the vascular anatomy. Carotid stenosis measurements (when applicable) are obtained utilizing NASCET criteria, using the distal internal carotid diameter  as the denominator. CONTRAST:  50 cc of Isovue 370. COMPARISON:  Prior CT from earlier the same day. FINDINGS: CTA NECK FINDINGS Aortic arch: Visualized aortic arch of normal caliber with normal branch pattern. No flow-limiting stenosis about the origin of the great vessels. Visualized subclavian artery is widely patent. Right carotid system: Right common and internal carotid artery's widely patent without stenosis, dissection, or occlusion. No atheromatous narrowing about the right carotid bifurcation. Left  carotid system: Left common and internal carotid artery's widely patent without stenosis, dissection, or occlusion. No significant atheromatous narrowing about the left carotid bifurcation. Vertebral arteries: Both of the vertebral arteries arise from the subclavian arteries. Vertebral artery's widely patent without stenosis, dissection, or occlusion. Skeleton: No acute osseus abnormality. No worrisome lytic or blastic osseous lesions. Other neck: No acute soft tissue abnormality within the neck. Salivary glands normal. Thyroid normal. No adenopathy. Upper chest: Visualized upper chest within normal limits. Visualized lungs are clear. Review of the MIP images confirms the above findings CTA HEAD FINDINGS Anterior circulation: The petrous, cavernous, and supraclinoid segments of the internal carotid arteries are widely patent without stenosis. ICA termini widely patent. A1 segments, anterior communicating artery common anterior cerebral arteries widely patent. M1 segments widely patent without stenosis or occlusion. MCA bifurcations normal. No proximal M2 occlusion. Distal MCA branches well perfused and symmetric. Posterior circulation: Vertebral artery's widely patent to the vertebrobasilar junction. Posterior inferior cerebral arteries patent bilaterally. Basilar artery widely patent to its distal aspect. Superior cerebellar and posterior cerebral arteries widely patent bilaterally. Venous sinuses: Patent. Anatomic variants: None significant. No aneurysm or vascular malformation. Delayed phase: No pathologic enhancement. Evolving 19 mm acute ischemic infarct noted within the right internal capsule/ corona radiata (series 13, image 17). Review of the MIP images confirms the above findings IMPRESSION: 1. Normal CTA of the head and neck. 2. 19 mm evolving acute ischemic infarct within the right internal capsule/corona radiata. Electronically Signed   By: Rise Mu M.D.   On: 01/09/2017 19:53   Ct Head Wo  Contrast  Result Date: 01/09/2017 CLINICAL DATA:  Left-sided facial droop and weakness since 01/07/2017. Slurred speech with left arm and leg numbness. EXAM: CT HEAD WITHOUT CONTRAST TECHNIQUE: Contiguous axial images were obtained from the base of the skull through the vertex without intravenous contrast. COMPARISON:  None. FINDINGS: Brain: There is low-density in the right internal capsule anterior limb, asymmetric and primarily suspicious for recent small-vessel infarct. Asymmetric periventricular white matter low density extending superiorly from the frontal horn right lateral ventricle, without mass effect. No hemorrhage or hydrocephalus. Vascular: No hyperdense vessel or unexpected calcification. Skull: Negative Sinuses/Orbits: Negative Other: These results will be called to the ordering clinician or representative by the Radiologist Assistant, and communication documented in the PACS or zVision Dashboard. IMPRESSION: Recent infarct or other asymmetric abnormality in the anterior limb right internal capsule. Recommend MRI characterization. Electronically Signed   By: Marnee Spring M.D.   On: 01/09/2017 11:38   Ct Angio Neck W Or Wo Contrast  Result Date: 01/09/2017 CLINICAL DATA:  Initial evaluation for intermittent slurred speech with left-sided facial numbness in hands EXAM: CT ANGIOGRAPHY HEAD AND NECK TECHNIQUE: Multidetector CT imaging of the head and neck was performed using the standard protocol during bolus administration of intravenous contrast. Multiplanar CT image reconstructions and MIPs were obtained to evaluate the vascular anatomy. Carotid stenosis measurements (when applicable) are obtained utilizing NASCET criteria, using the distal internal carotid diameter as the denominator. CONTRAST:  50 cc of Isovue  370. COMPARISON:  Prior CT from earlier the same day. FINDINGS: CTA NECK FINDINGS Aortic arch: Visualized aortic arch of normal caliber with normal branch pattern. No flow-limiting  stenosis about the origin of the great vessels. Visualized subclavian artery is widely patent. Right carotid system: Right common and internal carotid artery's widely patent without stenosis, dissection, or occlusion. No atheromatous narrowing about the right carotid bifurcation. Left carotid system: Left common and internal carotid artery's widely patent without stenosis, dissection, or occlusion. No significant atheromatous narrowing about the left carotid bifurcation. Vertebral arteries: Both of the vertebral arteries arise from the subclavian arteries. Vertebral artery's widely patent without stenosis, dissection, or occlusion. Skeleton: No acute osseus abnormality. No worrisome lytic or blastic osseous lesions. Other neck: No acute soft tissue abnormality within the neck. Salivary glands normal. Thyroid normal. No adenopathy. Upper chest: Visualized upper chest within normal limits. Visualized lungs are clear. Review of the MIP images confirms the above findings CTA HEAD FINDINGS Anterior circulation: The petrous, cavernous, and supraclinoid segments of the internal carotid arteries are widely patent without stenosis. ICA termini widely patent. A1 segments, anterior communicating artery common anterior cerebral arteries widely patent. M1 segments widely patent without stenosis or occlusion. MCA bifurcations normal. No proximal M2 occlusion. Distal MCA branches well perfused and symmetric. Posterior circulation: Vertebral artery's widely patent to the vertebrobasilar junction. Posterior inferior cerebral arteries patent bilaterally. Basilar artery widely patent to its distal aspect. Superior cerebellar and posterior cerebral arteries widely patent bilaterally. Venous sinuses: Patent. Anatomic variants: None significant. No aneurysm or vascular malformation. Delayed phase: No pathologic enhancement. Evolving 19 mm acute ischemic infarct noted within the right internal capsule/ corona radiata (series 13, image  17). Review of the MIP images confirms the above findings IMPRESSION: 1. Normal CTA of the head and neck. 2. 19 mm evolving acute ischemic infarct within the right internal capsule/corona radiata. Electronically Signed   By: Rise Mu M.D.   On: 01/09/2017 19:53   Mr Laqueta Jean WU Contrast  Result Date: 01/10/2017 CLINICAL DATA:  Demyelinating disease without cord symptoms. Focal neurologic deficit. Slurred speech left-sided numbness and weakness EXAM: MRI HEAD WITHOUT AND WITH CONTRAST TECHNIQUE: Multiplanar, multiecho pulse sequences of the brain and surrounding structures were obtained without and with intravenous contrast. CONTRAST:  20 mL MultiHance IV COMPARISON:  CTA 01/09/2017 FINDINGS: Brain: Rounded area of restricted diffusion in the posterior limb internal capsule and deep white matter tracts on the right measuring 2 cm. This does not enhance and is most consistent with acute infarct. Additionally, there is hyperintensity in the corona radiata and deep white matter on the right compatible with chronic infarct. No left-sided lesions identified. Brainstem normal. Negative for hemorrhage or mass. No enhancing lesions identified. Ventricle size normal without midline shift. Vascular: Normal arterial flow voids. Skull and upper cervical spine: Negative Sinuses/Orbits: Negative Other: None IMPRESSION: Acute infarct in the posterior limb internal capsule and deep white matter tracts on the right. Additional smaller area of chronic infarct in the deep white matter on the right. No other signs of demyelinating disease identified. Electronically Signed   By: Marlan Palau M.D.   On: 01/10/2017 11:06     Microbiology: No results found for this or any previous visit (from the past 240 hour(s)).   Labs: Basic Metabolic Panel:  Last Labs    Recent Labs Lab 01/09/17 1228 01/10/17 0648 01/11/17 0719  NA 138 139 137  K 3.7 3.9 4.1  CL 107 109 107  CO2 23 25 24  GLUCOSE 111* 92 113*    BUN CREATININE 0.85 0.92 0.83  CALCIUM 8.8* 8.2* 8.7*     Liver Function Tests: Last Labs   No results for input(s): AST, ALT, ALKPHOS, BILITOT, PROT, ALBUMIN in the last 168 hours.   Last Labs   No results for input(s): LIPASE, AMYLASE in the last 168 hours.   Last Labs   No results for input(s): AMMONIA in the last 168 hours.   CBC:  Last Labs    Recent Labs Lab 01/09/17 1228 01/10/17 0648 01/11/17 0644  WBC 7.1 6.8 7.8  NEUTROABS 5.0  --   --   HGB 13.3 12.4 12.6  HCT 38.6 37.8 38.3  MCV 93.2 93.1 93.0  PLT 251 212 232     Cardiac Enzymes: Last Labs   No results for input(s): CKTOTAL, CKMB, CKMBINDEX, TROPONINI in the last 168 hours.   BNP: BNP (last 3 results) Recent Labs (within last 365 days)  No results for input(s): BNP in the last 8760 hours.    ProBNP (last 3 results) Recent Labs (within last 365 days)  No results for input(s): PROBNP in the last 8760 hours.    CBG: Last Labs   No results for input(s): GLUCAP in the last 168 hours.       SignedEdsel Grant  [facility] 01/11/2017, 1:52 PM     Electronically signed by Virginia Petrin, DO at 01/11/2017 1:52 PM    For TEE; no changes. Olga Millers

## 2017-01-26 NOTE — Discharge Instructions (Signed)

## 2017-01-26 NOTE — Interval H&P Note (Signed)
History and Physical Interval Note:  01/26/2017 12:28 PM  Virginia Grant  has presented today for surgery, with the diagnosis of CVA  The various methods of treatment have been discussed with the patient and family. After consideration of risks, benefits and other options for treatment, the patient has consented to  Procedure(s): TRANSESOPHAGEAL ECHOCARDIOGRAM (TEE) (N/A) as a surgical intervention .  The patient's history has been reviewed, patient examined, no change in status, stable for surgery.  I have reviewed the patient's chart and labs.  Questions were answered to the patient's satisfaction.     Olga Millers

## 2017-01-26 NOTE — Progress Notes (Signed)
    Transesophageal Echocardiogram Note  Virginia Grant [id number] 1975-12-11  Procedure: Transesophageal Echocardiogram Indications: CVA  Procedure Details Consent: Obtained Time Out: Verified patient identification, verified procedure, site/side was marked, verified correct patient position, special equipment/implants available, Radiology Safety Procedures followed,  medications/allergies/relevent history reviewed, required imaging and test results available.  Performed  Medications:  During this procedure the patient is administered a total of Versed 7 mg and Fentanyl 75 mcg  to achieve and maintain moderate conscious sedation.  The patient's heart rate, blood pressure, and oxygen saturation are monitored continuously during the procedure. The period of conscious sedation is 30 minutes, of which I was present face-to-face 100% of this time.  Normal LV function; negative saline microcavitation study.   Complications: No apparent complications Patient did tolerate procedure well.  Olga Millers, MD

## 2017-01-26 NOTE — Progress Notes (Signed)
Echocardiogram Echocardiogram Transesophageal has been performed.  Virginia Grant 01/26/2017, 1:51 PM

## 2017-01-27 ENCOUNTER — Ambulatory Visit: Payer: BC Managed Care – PPO | Attending: Internal Medicine

## 2017-01-27 ENCOUNTER — Encounter (HOSPITAL_COMMUNITY): Payer: Self-pay | Admitting: Cardiology

## 2017-01-27 ENCOUNTER — Telehealth: Payer: Self-pay | Admitting: Family

## 2017-01-27 DIAGNOSIS — R41841 Cognitive communication deficit: Secondary | ICD-10-CM | POA: Insufficient documentation

## 2017-01-27 DIAGNOSIS — Z8673 Personal history of transient ischemic attack (TIA), and cerebral infarction without residual deficits: Secondary | ICD-10-CM

## 2017-01-27 NOTE — Telephone Encounter (Signed)
Baldo Ash from Neuro rehab speech therapy called in to speak with provider. He said that he seen pt today due to her having a stroke last month. He said that pt would prefer to go to [facility] to have her therapy. He said that pt stated that it is easier for her travel. He also stated that pt experience fatigue, which doesn't make her travel any better.    His phone: [phone]

## 2017-01-27 NOTE — Telephone Encounter (Signed)
Pt's husband dropped off handicap application, documents placed in tray at front office , will pick up when available

## 2017-01-27 NOTE — Therapy (Signed)
[facility] [address] [place], [place], [address] Phone: [phone]   Fax:  [phone]  Patient Details  Name: Virginia Grant MRN: [id number] Date of Birth: 1976/02/16 Referring Provider:  Sandford Craze, FNP  Encounter Date: 01/27/2017  ST Arrived-Cancel  Pt arrived today c/o higher level cognitive communication deficits.  She and husband stated that pt experiencing much fatigue and they would prefer OT/PT/ST services closer to home - [facility].  SLP called FNP's Sandford Craze) office and left message about this for Melissa, as she was out of office today. At this time, pt/husband expecting a call from either primary care office, or outpatient rehab at [facility] for therapy scheduling.  [facility] ,MS, CCC-SLP  01/27/2017, 10:17 AM  [facility] [address] [place], [place], [address] Phone: [phone]   Fax:  [phone]

## 2017-01-29 NOTE — Telephone Encounter (Signed)
Please ask pt if she wishes to do all PT/OT and ST at [facility] and if so I will arrange.

## 2017-01-30 NOTE — Telephone Encounter (Signed)
Received Application and Renewal of Handicapped Drivers Registration Plate form [place] [employer]; forwarded to provider/SLS 10/08

## 2017-01-31 NOTE — Telephone Encounter (Signed)
Verified with pt that she wishes to do PT/OT and ST at [facility] as it is closer to her.

## 2017-02-03 ENCOUNTER — Telehealth: Payer: Self-pay | Admitting: Family

## 2017-02-03 NOTE — Telephone Encounter (Signed)
Pt's spouse dropped off document for provider to fill out (Disability Parking Placard- 1 page) pt's spouse stated already had one document filled out (Disability Parking PLATE) but that  was the wrong document brought in. Pt would like to have the one dropped off this time to be filled out for Placard. Please call pt's spouse at tel [phone] when ready to pick up. Document put at front office tray.

## 2017-02-06 NOTE — Telephone Encounter (Signed)
Form signed and placed at front desk for pick up. Notified pt.

## 2017-02-06 NOTE — Telephone Encounter (Signed)
Completed provider information and forwarded to provider for signature/SLS 10/15

## 2017-02-09 ENCOUNTER — Other Ambulatory Visit: Payer: Self-pay | Admitting: Family

## 2017-02-10 NOTE — Telephone Encounter (Signed)
Melissa-- we have not filled these for pt before. Please advise if ok to send refills?

## 2017-02-13 ENCOUNTER — Encounter: Payer: Self-pay | Admitting: Family

## 2017-02-13 ENCOUNTER — Ambulatory Visit (INDEPENDENT_AMBULATORY_CARE_PROVIDER_SITE_OTHER): Payer: BC Managed Care – PPO | Admitting: Family

## 2017-02-13 VITALS — BP 114/69 | HR 85 | Temp 97.9°F | Resp 18 | Ht 66.5 in | Wt 209.4 lb

## 2017-02-13 DIAGNOSIS — F32A Depression, unspecified: Secondary | ICD-10-CM

## 2017-02-13 DIAGNOSIS — F329 Major depressive disorder, single episode, unspecified: Secondary | ICD-10-CM | POA: Diagnosis not present

## 2017-02-13 DIAGNOSIS — I639 Cerebral infarction, unspecified: Secondary | ICD-10-CM | POA: Diagnosis not present

## 2017-02-13 DIAGNOSIS — Z0000 Encounter for general adult medical examination without abnormal findings: Secondary | ICD-10-CM

## 2017-02-13 NOTE — Progress Notes (Signed)
Subjective:    Patient ID: Virginia Grant, female    DOB: [birth year], 41 y.o.   MRN: [id number]  HPI  Pt is a 41 yr old female who presents today for follow up. She was hospitalize 01/09/17 with CVA.  Last visit we continued asa and statin for secondary stroke prevention. She was referred for PT and OT. Since that visit she has met with cardiology and had a neg TEE.  I wrote her out of work for 1 month. Today she reports that she continues to have a great deal of fatigue and does not feel physically ready to return to work   She is able to do the ADL's at home but fatigues very easily.   Sleeping 12 hours a night.  Notes that she has been very emotional lately. She is followed by psychiatry Metta Clines.   Review of Systems  See HPI  Past Medical History:  Diagnosis Date  . Anxiety   . Depression   . Hypertension      Social History   Social History  . Marital status: Married    Spouse name: Gerald Stabs   . Number of children: 1  . Years of education: 77   Occupational History  .  [employer]   Social History Main Topics  . Smoking status: Former Smoker    Packs/day: 1.00    Years: 5.00    Types: Cigarettes    Quit date: 04/26/1995  . Smokeless tobacco: Never Used  . Alcohol use Yes     Comment: Rarely  . Drug use: No  . Sexual activity: Yes    Partners: Male   Other Topics Concern  . Not on file   Social History Narrative   Marital Status:  Married Gerald Stabs)    Children:  Son Olen Cordial) born 2000   Pets: Dogs 2   Living Situation: Lives with husband and son.   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  She quit in 1997.     Alcohol Use:  Rarely   Drug Use:  None   Diet:  Regular   Exercise:  Limited; She was exercising up until December but has gotten off track. She was walking/running and was going to the [facility].     Hobbies:  Family                 Past  Surgical History:  Procedure Laterality Date  . ESSURE TUBAL LIGATION    . NO PAST SURGERIES    . TEE WITHOUT CARDIOVERSION N/A 01/26/2017   Procedure: TRANSESOPHAGEAL ECHOCARDIOGRAM (TEE);  Surgeon: Lelon Perla, MD;  Location: [facility] ENDOSCOPY;  Service: Cardiovascular;  Laterality: N/A;    Family History  Problem Relation Age of Onset  . Hypertension Mother   . Diabetes Father   . Hypertension Father   . Cancer Maternal Aunt        Colon  . Cancer Maternal Uncle        Colon   . Heart attack Maternal Uncle   . COPD Maternal Grandmother   . Heart disease Maternal Grandmother   . Hypertension Brother     No Known Allergies  Current Outpatient Prescriptions on File Prior to Visit  Medication Sig Dispense Refill  . atorvastatin (LIPITOR) 10 MG tablet TAKE 1 TABLET (10 MG TOTAL) BY MOUTH DAILY AT 6 PM. 30 tablet  11  . [facility] ASPIRIN EC 325 MG EC tablet TAKE 1 TABLET BY MOUTH EVERY DAY 30 tablet 11  . ibuprofen (ADVIL,MOTRIN) 200 MG tablet Take 600 mg by mouth every 6 (six) hours as needed for headache (pain).    Marland Kitchen venlafaxine XR (EFFEXOR-XR) 150 MG 24 hr capsule Take 300 mg by mouth daily with breakfast.     No current facility-administered medications on file prior to visit.     BP 114/69 (BP Location: Left Arm, Cuff Size: Large)   Pulse 85   Temp 97.9 F (36.6 C) (Oral)   Resp 18   Ht 5' 6.5" (1.689 m)   Wt 209 lb 6.4 oz (95 kg)   LMP 02/08/2017   SpO2 99%   BMI 33.29 kg/m        Objective:   Physical Exam  Constitutional: She is oriented to person, place, and time. She appears well-developed and well-nourished.  HENT:  Head: Normocephalic and atraumatic.  Cardiovascular: Normal rate, regular rhythm and normal heart sounds.   No murmur heard. Pulmonary/Chest: Effort normal and breath sounds normal. No respiratory distress. She has no wheezes.  Musculoskeletal: She exhibits no edema.  Neurological: She is alert and oriented to person, place, and time.  Left  facial drooping and weakness is again noted but is less prominent than last visit. Right upper extremity strength is 5 out of 5 left upper extremity strength and grip is mildly diminished. Improved from last visit. Bilateral lower extremity strength is 5 out of 5  Psychiatric: She has a normal mood and affect. Her behavior is normal. Judgment and thought content normal.          Assessment & Plan:  Hx CVA- reports that there was a delay in getting her physical therapy scheduled but it is currently scheduled. She has yet to gain the stamina to return to work. I have advised her to stay out of work for one additional month while she continues PT and OT. She is advised to keep her upcoming appointment with neurology.  Depression-patient scored 9 today on pH Q9. She denies suicide ideation however she notes some hopelessness. She is maintained on Effexor and is followed by psychiatry, Metta Clines NP.  She is established with a counselor who she was working with prior to her stroke due to concerns over marital stressors. She plans to return to see this therapist. She does not wish to adjust her antidepressants at this time. I have advised her to get back in touch with psychiatry if she feels that her symptoms worsen or if they do not continue to improve the next few months.

## 2017-02-13 NOTE — Patient Instructions (Signed)
Please keep your upcoming appointment for counseling. Continue OT and please begin PT as scheduled.

## 2017-02-14 ENCOUNTER — Other Ambulatory Visit: Payer: Self-pay | Admitting: Family

## 2017-02-14 ENCOUNTER — Encounter (HOSPITAL_BASED_OUTPATIENT_CLINIC_OR_DEPARTMENT_OTHER): Payer: Self-pay

## 2017-02-14 ENCOUNTER — Ambulatory Visit (HOSPITAL_BASED_OUTPATIENT_CLINIC_OR_DEPARTMENT_OTHER)
Admission: RE | Admit: 2017-02-14 | Discharge: 2017-02-14 | Disposition: A | Discharge status: Home / Self Care | Payer: BC Managed Care – PPO | Source: Ambulatory Visit | Attending: Family | Admitting: Family

## 2017-02-14 DIAGNOSIS — Z0000 Encounter for general adult medical examination without abnormal findings: Secondary | ICD-10-CM

## 2017-02-14 DIAGNOSIS — Z1231 Encounter for screening mammogram for malignant neoplasm of breast: Secondary | ICD-10-CM | POA: Diagnosis present

## 2017-02-14 NOTE — Telephone Encounter (Signed)
Melissa-- we have not sent Venlafaxine Rx before. Ok to send refill or who should be managing it?

## 2017-02-15 ENCOUNTER — Telehealth: Payer: Self-pay | Admitting: Family

## 2017-02-15 ENCOUNTER — Other Ambulatory Visit: Payer: Self-pay | Admitting: Family

## 2017-02-15 NOTE — Telephone Encounter (Signed)
Sent mychart message to pt notifying her to contact psychiatrist for Venlafaxine refills.

## 2017-02-15 NOTE — Telephone Encounter (Signed)
Spoke with pharmacist, Bernerd. He states they received denials for ASA and Atorvastatin. Advised him our system shows both Rxs went out normally without any notice of transmission failure on 02/10/17. Gave verbal to for both RXs as authorized on 02/10/17. Attempted to notify pt and left message to check mychart account. Message sent.

## 2017-02-15 NOTE — Telephone Encounter (Signed)
Psychiatry- Virginia Grant is managing effexor.

## 2017-02-15 NOTE — Telephone Encounter (Signed)
[name] name: Thayer OhmChris Relation to pt: spouse  Call back number: [phone] Pharmacy: [id number] [place], [place] - [address]  Reason for call: Pt's spouse came in office regarding about pt needing rx atorvastatin (LIPITOR) 10 MG tablet and [facility] ASPIRIN EC 325 MG EC tablet, spouse stated went to the pharmacy and that they have not received the prescription yet. Pt is needing med ASAP, and spouse stated if possible would like to have the rx printed out and he can pick up for her since this has been going on for 5 days and the pharmacy still having issues with not having rx with them. (spouse not on DPR and was not shared the info about meds already sent out- he was informed the nurse will contact pt for the information. Please advise ASAP.

## 2017-02-27 ENCOUNTER — Ambulatory Visit (INDEPENDENT_AMBULATORY_CARE_PROVIDER_SITE_OTHER): Payer: BC Managed Care – PPO | Admitting: Neurology

## 2017-02-27 ENCOUNTER — Encounter: Payer: Self-pay | Admitting: Neurology

## 2017-02-27 VITALS — Ht 67.0 in | Wt 215.2 lb

## 2017-02-27 DIAGNOSIS — I639 Cerebral infarction, unspecified: Secondary | ICD-10-CM

## 2017-02-27 NOTE — Patient Instructions (Signed)
I had a long d/w patient about her recent cryptogenic stroke, risk for recurrent stroke/TIAs, personally independently reviewed imaging studies and stroke evaluation results and answered questions.Continue aspirin 325 mg daily  for secondary stroke prevention and maintain strict control of hypertension with blood pressure goal below 130/90, diabetes with hemoglobin A1c goal below 6.5% and lipids with LDL cholesterol goal below 70 mg/dL. I also advised the patient to eat a healthy diet with plenty of whole grains, cereals, fruits and vegetables, exercise regularly and maintain ideal body weight .She is interested in anticipating in the [facility] stroke registry.  Followup in the future with my nurse practitioner in 6 months or call earlier if necessary Stroke Prevention Some medical conditions and behaviors are associated with an increased chance of having a stroke. You may prevent a stroke by making healthy choices and managing medical conditions. How can I reduce my risk of having a stroke?  Stay physically active. Get at least 30 minutes of activity on most or all days.  Do not smoke. It may also be helpful to avoid exposure to secondhand smoke.  Limit alcohol use. Moderate alcohol use is considered to be: ? No more than 2 drinks per day for men. ? No more than 1 drink per day for nonpregnant women.  Eat healthy foods. This involves: ? Eating 5 or more servings of fruits and vegetables a day. ? Making dietary changes that address high blood pressure (hypertension), high cholesterol, diabetes, or obesity.  Manage your cholesterol levels. ? Making food choices that are high in fiber and low in saturated fat, trans fat, and cholesterol may control cholesterol levels. ? Take any prescribed medicines to control cholesterol as directed by your health care provider.  Manage your diabetes. ? Controlling your carbohydrate and sugar intake is recommended to manage diabetes. ? Take any prescribed  medicines to control diabetes as directed by your health care provider.  Control your hypertension. ? Making food choices that are low in salt (sodium), saturated fat, trans fat, and cholesterol is recommended to manage hypertension. ? Ask your health care provider if you need treatment to lower your blood pressure. Take any prescribed medicines to control hypertension as directed by your health care provider. ? If you are 4218-43 years of age, have your blood pressure checked every 3-5 years. If you are 43 years of age or older, have your blood pressure checked every year.  Maintain a healthy weight. ? Reducing calorie intake and making food choices that are low in sodium, saturated fat, trans fat, and cholesterol are recommended to manage weight.  Stop drug abuse.  Avoid taking birth control pills. ? Talk to your health care provider about the risks of taking birth control pills if you are over 43 years old, smoke, get migraines, or have ever had a blood clot.  Get evaluated for sleep disorders (sleep apnea). ? Talk to your health care provider about getting a sleep evaluation if you snore a lot or have excessive sleepiness.  Take medicines only as directed by your health care provider. ? For some people, aspirin or blood thinners (anticoagulants) are helpful in reducing the risk of forming abnormal blood clots that can lead to stroke. If you have the irregular heart rhythm of atrial fibrillation, you should be on a blood thinner unless there is a good reason you cannot take them. ? Understand all your medicine instructions.  Make sure that other conditions (such as anemia or atherosclerosis) are addressed. Get help right away  if:  You have sudden weakness or numbness of the face, arm, or leg, especially on one side of the body.  Your face or eyelid droops to one side.  You have sudden confusion.  You have trouble speaking (aphasia) or understanding.  You have sudden trouble seeing  in one or both eyes.  You have sudden trouble walking.  You have dizziness.  You have a loss of balance or coordination.  You have a sudden, severe headache with no known cause.  You have new chest pain or an irregular heartbeat. Any of these symptoms may represent a serious problem that is an emergency. Do not wait to see if the symptoms will go away. Get medical help at once. Call your local emergency services (911 in [place] Do not drive yourself to the hospital. This information is not intended to replace advice given to you by your health care provider. Make sure you discuss any questions you have with your health care provider. Document Released: 05/19/2004 Document Revised: 09/17/2015 Document Reviewed: 10/12/2012 Elsevier Interactive Patient Education  2017 [employer].

## 2017-02-27 NOTE — Progress Notes (Signed)
[facility] [address] [place]. [place] [address] [phone]       OFFICE FOLLOW-UP NOTE  Virginia. Ezequiel Essex Date of Birth:  1975-04-30 Medical Record Number:  [id number]   HPI: Virginia Grant is a pleasant 41 year Caucasian lady seen today for the first office follow-up visit after admission for stroke in September 2018.  History is obtained from the patient and review of electronic medical records and I have personally reviewed imaging films.She has a  past medical history significant for anxiety and depression, brought from [facility] for further workup regarding strokelike symptoms. In review, the patient was in her usual state of health until about 01/07/17, when she noted some drooping in the left lower side of her face, without any sensory deficits. The patient thought at the time to have Bell's palsy, for which she did not seek medical attention. This morning, the patient noted slurred speech, with an resolving left facial droop, left arm numbness and tingling, and mild left lower extremity weakness. Patient never had a similar episode. Denied any history of TIA. Denies vertigo dizziness or vision changes. Denied headaches No confusion or seizures. Denied any chest pain, or shortness of breath.,any fever or chills, night sweats. any neck pain, urine retention or incontinence. No tobacco abuse. No new meds or hormonal supplements. Did nottake a regular ASA a day, ot other antiplatelets or anticoagulants.Denied any recent long distance trips or recent surgeries. No sick contacts. Denied diabetes or multiple sclerosishistory. Family history of stroke in 2 aunts.1 sister with history of multiple miscarriages.Patient was not administered TPA as is beyond time window for treatment consideration.  MRI scan of the brain showed a large 2 cm acute infarct in the posterior limb internal capsule and deep white matter.  CT angiogram of the brain and neck showed no large  vessel extracranial or intracranial stenosis.  LDL cholesterol was slightly elevated at 77 mg percent and hemoglobin A1c was 4.8.  Transcranial Doppler bubble study showed no evidence of right-to-left intracardiac shunt.  Transthoracic echo and transesophageal echo both did not show any definite evidence of cardiac source of embolism.  Patient's vascular risk factors identified included borderline hypertension, hyperlipidemia and mild obesity.  She was started on aspirin for stroke prevention and Lipitor.  She states she is tolerating both medications well without any bruising, bleeding, upset stomach or muscle aches or pains.  She is participating in outpatient physical therapy and she states her facial droop as well as hand strength is improving.  She still drags her left foot a little bit when tired.  A fine motor skills and dexterity is also not yet back to normal.  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  She has no prior history of deep vein thrombosis, pulmonary embolism, heart attacks or any family history of strokes or heart attacks at a young age.  She had lab work for ESR, ANA panel and antiphospholipid antibodies all of which was negative.    ROS:   14 system review of systems is positive for dizziness, headache, agitation, depression, nervousness, anxiety and all other systems negative  PMH:  Past Medical History:  Diagnosis Date  . Anxiety   . Depression   . Headache   . Hypertension   . Stroke [facility])     Social History:  Social History   Socioeconomic History  . Marital status: Married    Spouse name:  Chris   . Number of children: 1  . Years of education: 33  . Highest education level: Not on file  Social Needs  . Financial resource strain: Not on file  . Food insecurity - worry: Not on file  . Food insecurity - inability: Not on file  . Transportation needs - medical: Not on file  . Transportation needs -  non-medical: Not on file  Occupational History    Employer: [employer]  Tobacco Use  . Smoking status: Former Smoker    Packs/day: 1.00    Years: 5.00    Pack years: 5.00    Types: Cigarettes    Last attempt to quit: 04/26/1995    Years since quitting: 21.8  . Smokeless tobacco: Never Used  Substance and Sexual Activity  . Alcohol use: Yes    Alcohol/week: 1.8 oz    Types: 1 Glasses of wine, 1 Cans of beer, 1 Shots of liquor per week    Comment: Rarely  . Drug use: No  . Sexual activity: Yes    Partners: Male  Other Topics Concern  . Not on file  Social History Narrative   Marital Status:  Married Gerald Stabs)    Children:  Son Olen Cordial) born 2000   Pets: Dogs 2   Living Situation: Lives with husband and son.   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  She quit in 1997.     Alcohol Use:  Rarely   Drug Use:  None   Diet:  Regular   Exercise:  Limited; She was exercising up until December but has gotten off track. She was walking/running and was going to the [facility] [place] [facility].     Hobbies:  Family              Medications:   Current Outpatient Medications on File Prior to Visit  Medication Sig Dispense Refill  . atorvastatin (LIPITOR) 10 MG tablet TAKE 1 TABLET (10 MG TOTAL) BY MOUTH DAILY AT 6 PM. 30 tablet 11  . [facility]S ASPIRIN EC 325 MG EC tablet TAKE 1 TABLET BY MOUTH EVERY DAY 30 tablet 11  . ibuprofen (ADVIL,MOTRIN) 200 MG tablet Take 600 mg by mouth every 6 (six) hours as needed for headache (pain).    Marland Kitchen venlafaxine XR (EFFEXOR-XR) 150 MG 24 hr capsule Take 300 mg by mouth daily with breakfast.     No current facility-administered medications on file prior to visit.     Allergies:  No Known Allergies  Physical Exam General: well developed, well nourished middle-aged Caucasian lady, seated, in no evident distress Head: head normocephalic and atraumatic.  Neck: supple with no  carotid or supraclavicular bruits Cardiovascular: regular rate and rhythm, no murmurs Musculoskeletal: no deformity Skin:  no rash/petichiae Vascular:  Normal pulses all extremities There were no vitals filed for this visit. Neurologic Exam Mental Status: Awake and fully alert. Oriented to place and time. Recent and remote memory intact. Attention span, concentration and fund of knowledge appropriate. Mood and affect appropriate.  Cranial Nerves: Fundoscopic exam reveals sharp disc margins. Pupils equal, briskly reactive to light. Extraocular movements full without nystagmus. Visual fields full to confrontation. Hearing intact. Facial sensation intact. Face, tongue, palate moves normally and symmetrically.  Motor: Normal bulk and tone. Normal strength in all tested extremity muscles.  Except mild left grip weakness, diminished fine finger movements on the left.  Orbits right over left upper extremity. Sensory.: intact to touch ,pinprick .position and vibratory sensation.  Coordination: Rapid alternating movements normal in all extremities. Finger-to-nose and heel-to-shin performed accurately bilaterally. Gait and Station: Arises from chair without difficulty. Stance is normal. Gait demonstrates normal stride length and balance with only mild dragging of the left leg.. Able to heel, toe and tandem walk with slight difficulty.  Reflexes: 1+ and symmetric. Toes downgoing.   NIHSS 0 Modified Rankin 2   ASSESSMENT: 41 year old Caucasian lady with 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  Vascular risk factors of borderline hyperlipidemia and mild obesity only.    PLAN:  had a long d/w patient about her recent cryptogenic stroke, risk for recurrent stroke/TIAs, personally independently reviewed imaging studies and stroke evaluation results and answered questions.Continue aspirin 325 mg daily  for secondary stroke prevention and maintain strict control of hypertension with  blood pressure goal below 130/90, diabetes with hemoglobin A1c goal below 6.5% and lipids with LDL cholesterol goal below 70 mg/dL. I also advised the patient to eat a healthy diet with plenty of whole grains, cereals, fruits and vegetables, exercise regularly and maintain ideal body weight .She is interested in anticipating in the [facility] stroke registry.  Followup in the future with my nurse practitioner in 6 months or call earlier if necessary.Greater than 50% of time during this 30 minute visit was spent on counseling,explanation of diagnosis, planning of further management, discussion with patient and family and coordination of care Antony Contras, MD Medical Director [place] Pager: [phone] 02/27/2017 9:32 AM  Note: This document was prepared with digital dictation and possible smart phrase technology. Any transcriptional errors that result from this process are unintentional

## 2017-03-06 ENCOUNTER — Ambulatory Visit: Payer: BC Managed Care – PPO | Admitting: Family

## 2017-03-10 ENCOUNTER — Encounter: Payer: Self-pay | Admitting: Family

## 2017-03-10 ENCOUNTER — Ambulatory Visit (INDEPENDENT_AMBULATORY_CARE_PROVIDER_SITE_OTHER): Payer: BC Managed Care – PPO | Admitting: Family

## 2017-03-10 VITALS — BP 117/62 | HR 84 | Temp 98.1°F | Resp 16 | Ht 66.0 in | Wt 209.0 lb

## 2017-03-10 DIAGNOSIS — T7431XA Adult psychological abuse, confirmed, initial encounter: Secondary | ICD-10-CM

## 2017-03-10 DIAGNOSIS — Z8673 Personal history of transient ischemic attack (TIA), and cerebral infarction without residual deficits: Secondary | ICD-10-CM

## 2017-03-10 DIAGNOSIS — R5383 Other fatigue: Secondary | ICD-10-CM | POA: Diagnosis not present

## 2017-03-10 DIAGNOSIS — R635 Abnormal weight gain: Secondary | ICD-10-CM | POA: Diagnosis not present

## 2017-03-10 LAB — T4, FREE: FREE T4: 0.9 ng/dL (ref 0.60–1.60)

## 2017-03-10 LAB — T3, FREE: T3 FREE: 2.8 pg/mL (ref 2.3–4.2)

## 2017-03-10 LAB — VITAMIN B12: VITAMIN B 12: 156 pg/mL — AB (ref 211–911)

## 2017-03-10 LAB — TSH: TSH: 1.64 u[IU]/mL (ref 0.35–4.50)

## 2017-03-10 LAB — VITAMIN D 25 HYDROXY (VIT D DEFICIENCY, FRACTURES): VITD: 21.76 ng/mL — AB (ref 30.00–100.00)
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
## 2017-03-12 DIAGNOSIS — T7431XA Adult psychological abuse, confirmed, initial encounter: Secondary | ICD-10-CM | POA: Insufficient documentation

## 2017-03-15 ENCOUNTER — Telehealth: Payer: Self-pay | Admitting: Family

## 2017-03-15 MED ORDER — CYANOCOBALAMIN 1000 MCG/ML IJ SOLN: INTRAMUSCULAR | 0 refills | Status: DC

## 2017-03-15 MED ORDER — VITAMIN D (ERGOCALCIFEROL) 1.25 MG (50000 UNIT) PO CAPS: 50000.0000 [IU] | ORAL_CAPSULE | ORAL | 0 refills | Status: DC

## 2017-03-15 NOTE — Telephone Encounter (Signed)
Hormonal lab work looks OK, however b12 and vit d are low. I would recommend that she start b12 injections:  IM weekly x 4 weeks then monthly, repeat level in 3 months.  Vitamin D level is low.  Advise patient to begin vit D 50000 units once weekly for 12 weeks, then repeat vit D level (dx Vit D deficiency).    Vit A still pending. Will route result when it comes back.

## 2017-03-17 ENCOUNTER — Encounter: Payer: Self-pay | Admitting: Family

## 2017-03-18 LAB — PROGESTERONE

## 2017-03-18 LAB — DHEA: DHEA: 240 ng/dL (ref 102–1185)

## 2017-03-18 LAB — VITAMIN A: Vitamin A (Retinoic Acid): 47 ug/dL (ref 38–98)

## 2017-03-18 LAB — ESTRADIOL: ESTRADIOL: 21 pg/mL

## 2017-03-20 NOTE — Telephone Encounter (Signed)
Left message for pt to return my call.

## 2017-03-20 NOTE — Telephone Encounter (Signed)
Pt given results and recommendations for tx according to notes on 11/21 by Melissa,NP. Pt agreeable to have B12 injections and appt scheduled for Dec. 6 for B12 injection. Pt also states that she has already picked up the prescription for Vit D. No other concerns at this time.

## 2017-03-20 NOTE — Telephone Encounter (Signed)
Jasmine DecemberSharon or Melissa-- Do either of you have this form?

## 2017-03-21 NOTE — Telephone Encounter (Signed)
Virginia Grant I believe I placed this in your folder the Tuesday before Thanksgiving.  Was it faxed?  Could you please update the patient?  Thank you

## 2017-03-21 NOTE — Telephone Encounter (Signed)
Patient calling and states its a form her work that was given to HanoverMelissa at office visit. Her work has yet to receive this form. Please fax to [employer]- [phone]

## 2017-03-21 NOTE — Telephone Encounter (Signed)
Patient advised she will need to have vit d and b 12 levels checked in 3 months. She will like to get done at next appointment visit on 06-05-17.

## 2017-03-22 NOTE — Telephone Encounter (Signed)
Relation to pt: self Call back number: [phone] (M)   Reason for call:  Patient checking on the status of forms and stated she would like form fax to the benefit department, the number she initial gave was incorrect please fax form to [phone] (Benefits) and [employer] [phone]. Patient would like confirmation when forms have been faxed please send message thru My Chart.

## 2017-03-22 NOTE — Telephone Encounter (Signed)
Melissa, I'm sorry but the only thing that I have here is a copy of their Handicap Driver's Registration Plate; I have been looking through files throughout the day. Jasmine DecemberSharon

## 2017-03-23 NOTE — Telephone Encounter (Signed)
Virginia Grant, I spoke with Virginia Grant and apologized for any inconvenience in this matter. She was very understanding and will have another form faxed to us tomorrow to complete. She asked if in the meantime, could you please send note [letter] to [facility] [phone]] and also, [employer] [[phone]], informing them that she would be returning to work by 04/03/17, tomorrow when you return to the office. Regis BillSharon L Elisabet Gutzmer

## 2017-03-23 NOTE — Telephone Encounter (Signed)
Please request another copy today and I will fill out again tomorrow in the office and let pt know.

## 2017-03-24 ENCOUNTER — Encounter: Payer: Self-pay | Admitting: Family

## 2017-03-24 NOTE — Telephone Encounter (Signed)
Please see return to work letter and fax to numbers below. thanks

## 2017-03-24 NOTE — Telephone Encounter (Signed)
Letter faxed to below #s. I have not received form from employer today. Sent mychart message to pt requesting form be faxed to nurse station to my attn. Awaiting form

## 2017-03-27 NOTE — Telephone Encounter (Signed)
Melissa-- please see below and advise.  Hi Javae Braaten,  Human Resources need for my return to work doctor's note to state if there are any restrictions needed when I return to work. So the only paperwork needed is another note stating that I can return to work on Monday 12/10 with no restrictions.  Thank you,  Laureen AbrahamsSandra Elrod

## 2017-03-27 NOTE — Telephone Encounter (Signed)
Letter printed and forwarded to PCP for signature.

## 2017-03-27 NOTE — Telephone Encounter (Signed)
Awaiting form from employer, see below.  Press the Enter key to open the contact card."[email or web address]>   Reply all   Fri 11/30, 3:13 PM  Mervin KungFergerson, Shareena Nusz  *Caution - External Email* I haven't received the paperwork; I requested another copy but waiting to hear back.  I will fax or bring it to your office when I get it.

## 2017-03-30 ENCOUNTER — Ambulatory Visit (INDEPENDENT_AMBULATORY_CARE_PROVIDER_SITE_OTHER): Payer: BC Managed Care – PPO

## 2017-03-30 DIAGNOSIS — E538 Deficiency of other specified B group vitamins: Secondary | ICD-10-CM

## 2017-03-30 MED ORDER — CYANOCOBALAMIN 1000 MCG/ML IJ SOLN
1000.0000 ug | Freq: Once | INTRAMUSCULAR | Status: AC
  Administered 2017-03-30: 1000 ug via INTRAMUSCULAR

## 2017-03-30 NOTE — Progress Notes (Signed)
Pre visit review using our clinic tool,if applicable. No additional management support is needed unless otherwise documented below in the visit note.   Patient in for B12 injection per order from M. Osullivan due to B12 deficency.  Given 1000 mcg IM left deltoid. Patient tolerated well. No complaints voiced this visit.  Return appointment scheduled for 04/05/17

## 2017-04-05 ENCOUNTER — Ambulatory Visit (INDEPENDENT_AMBULATORY_CARE_PROVIDER_SITE_OTHER): Payer: BC Managed Care – PPO

## 2017-04-05 DIAGNOSIS — E538 Deficiency of other specified B group vitamins: Secondary | ICD-10-CM | POA: Diagnosis not present

## 2017-04-05 MED ORDER — CYANOCOBALAMIN 1000 MCG/ML IJ SOLN
1000.0000 ug | Freq: Once | INTRAMUSCULAR | Status: AC
  Administered 2017-04-05: 1000 ug via INTRAMUSCULAR

## 2017-04-05 NOTE — Progress Notes (Signed)
Pre visit review using our clinic tool,if applicable. No additional management support is needed unless otherwise documented below in the visit note.   Patient in for B12 injection per order from M. O'sullivan due to patient having diagnosis of B12 deficiency.   Given 1000 mcg IM left deltoid. Patient tolerated well. No complaints voiced.   Return appointment scheduled for 1 week.

## 2017-04-06 NOTE — Progress Notes (Signed)
Reviewed and agree.

## 2017-04-12 ENCOUNTER — Ambulatory Visit (INDEPENDENT_AMBULATORY_CARE_PROVIDER_SITE_OTHER): Payer: BC Managed Care – PPO

## 2017-04-12 DIAGNOSIS — E538 Deficiency of other specified B group vitamins: Secondary | ICD-10-CM

## 2017-04-12 MED ORDER — CYANOCOBALAMIN 1000 MCG/ML IJ SOLN
1000.0000 ug | Freq: Once | INTRAMUSCULAR | Status: AC
  Administered 2017-05-23: 1000 ug via INTRAMUSCULAR

## 2017-04-12 MED ORDER — CYANOCOBALAMIN 1000 MCG/ML IJ SOLN
1000.0000 ug | Freq: Once | INTRAMUSCULAR | Status: AC
  Administered 2017-04-12: 1000 ug via INTRAMUSCULAR

## 2017-04-12 NOTE — Progress Notes (Signed)
Pre visit review using our clinic tool,if applicable. No additional management support is needed unless otherwise documented below in the visit note.   Patient in for B12 injection per order from Sandford CrazeMelissa O'Sullivan, NP.Marland Kitchen. Patient has B12 deficiency.  No complaints voiced this visit   Given 1000 mcg  IM left deltoid. Patient tolerated well.   Return appointment scheduled.

## 2017-04-13 NOTE — Progress Notes (Signed)
Noted. Virginia Grant  

## 2017-04-20 ENCOUNTER — Ambulatory Visit (INDEPENDENT_AMBULATORY_CARE_PROVIDER_SITE_OTHER): Payer: BC Managed Care – PPO

## 2017-04-20 DIAGNOSIS — E538 Deficiency of other specified B group vitamins: Secondary | ICD-10-CM | POA: Diagnosis not present

## 2017-04-20 MED ORDER — CYANOCOBALAMIN 1000 MCG/ML IJ SOLN
1000.0000 ug | Freq: Once | INTRAMUSCULAR | Status: AC
  Administered 2017-04-20: 1000 ug via INTRAMUSCULAR

## 2017-04-20 NOTE — Progress Notes (Signed)
Pre visit review using our clinic tool,if applicable. No additional management support is needed unless otherwise documented below in the visit note.   Patient in for B12 injection per order from M. Osullivan due to patient having B12 deficiency.  1000 mcg given IM right deltoid. Patient tolerated well. Return appointment scheduled for 1 month.

## 2017-04-21 NOTE — Progress Notes (Signed)
Noted and agree. 

## 2017-05-23 ENCOUNTER — Ambulatory Visit (INDEPENDENT_AMBULATORY_CARE_PROVIDER_SITE_OTHER): Payer: BC Managed Care – PPO

## 2017-05-23 DIAGNOSIS — E538 Deficiency of other specified B group vitamins: Secondary | ICD-10-CM

## 2017-05-23 MED ORDER — CYANOCOBALAMIN 1000 MCG/ML IJ SOLN
1000.0000 ug | Freq: Once | INTRAMUSCULAR | Status: AC
  Administered 2017-05-23: 1000 ug via INTRAMUSCULAR

## 2017-05-23 NOTE — Progress Notes (Signed)
Pre visit review using our clinic tool,if applicable. No additional management support is needed unless otherwise documented below in the visit note.   Patient in for B12 Injection per order from M. Osullivan due to patient having B12 deficiency.  Given 1000 mcg IM left deltoid. Patient tolerated well. No complaints voiced this visit.  Appointment scheduled for next month.

## 2017-05-31 NOTE — Progress Notes (Signed)
Reviewed and agree.  Ngina Royer S O'Sullivan NP 

## 2017-06-01 ENCOUNTER — Ambulatory Visit: Payer: BC Managed Care – PPO | Admitting: Family Medicine

## 2017-06-01 ENCOUNTER — Encounter: Payer: Self-pay | Admitting: Family Medicine

## 2017-06-01 VITALS — BP 108/66 | HR 89 | Temp 98.0°F | Resp 16 | Ht 66.0 in | Wt 227.4 lb

## 2017-06-01 DIAGNOSIS — J029 Acute pharyngitis, unspecified: Secondary | ICD-10-CM | POA: Diagnosis not present

## 2017-06-01 LAB — POCT RAPID STREP A (OFFICE): Rapid Strep A Screen: NEGATIVE

## 2017-06-01 MED ORDER — AMOXICILLIN 875 MG PO TABS: 875.0000 mg | ORAL_TABLET | Freq: Two times a day (BID) | ORAL | 0 refills | Status: DC

## 2017-06-01 NOTE — Patient Instructions (Signed)
Strep Throat Strep throat is a bacterial infection of the throat. Your health care provider may call the infection tonsillitis or pharyngitis, depending on whether there is swelling in the tonsils or at the back of the throat. Strep throat is most common during the cold months of the year in children who are 5-44 years of age, but it can happen during any season in people of any age. This infection is spread from person to person (contagious) through coughing, sneezing, or close contact. What are the causes? Strep throat is caused by the bacteria called Streptococcus pyogenes. What increases the risk? This condition is more likely to develop in:  People who spend time in crowded places where the infection can spread easily.  People who have close contact with someone who has strep throat.  What are the signs or symptoms? Symptoms of this condition include:  Fever or chills.  Redness, swelling, or pain in the tonsils or throat.  Pain or difficulty when swallowing.  White or yellow spots on the tonsils or throat.  Swollen, tender glands in the neck or under the jaw.  Red rash all over the body (rare).  How is this diagnosed? This condition is diagnosed by performing a rapid strep test or by taking a swab of your throat (throat culture test). Results from a rapid strep test are usually ready in a few minutes, but throat culture test results are available after one or two days. How is this treated? This condition is treated with antibiotic medicine. Follow these instructions at home: Medicines  Take over-the-counter and prescription medicines only as told by your health care provider.  Take your antibiotic as told by your health care provider. Do not stop taking the antibiotic even if you start to feel better.  Have family members who also have a sore throat or fever tested for strep throat. They may need antibiotics if they have the strep infection. Eating and drinking  Do not  share food, drinking cups, or personal items that could cause the infection to spread to other people.  If swallowing is difficult, try eating soft foods until your sore throat feels better.  Drink enough fluid to keep your urine clear or pale yellow. General instructions  Gargle with a salt-water mixture 3-4 times per day or as needed. To make a salt-water mixture, completely dissolve -1 tsp of salt in 1 cup of warm water.  Make sure that all household members wash their hands well.  Get plenty of rest.  Stay home from school or work until you have been taking antibiotics for 24 hours.  Keep all follow-up visits as told by your health care provider. This is important. Contact a health care provider if:  The glands in your neck continue to get bigger.  You develop a rash, cough, or earache.  You cough up a thick liquid that is green, yellow-brown, or bloody.  You have pain or discomfort that does not get better with medicine.  Your problems seem to be getting worse rather than better.  You have a fever. Get help right away if:  You have new symptoms, such as vomiting, severe headache, stiff or painful neck, chest pain, or shortness of breath.  You have severe throat pain, drooling, or changes in your voice.  You have swelling of the neck, or the skin on the neck becomes red and tender.  You have signs of dehydration, such as fatigue, dry mouth, and decreased urination.  You become increasingly sleepy, or   you cannot wake up completely.  Your joints become red or painful. This information is not intended to replace advice given to you by your health care provider. Make sure you discuss any questions you have with your health care provider. Document Released: 04/08/2000 Document Revised: 12/09/2015 Document Reviewed: 08/04/2014 Elsevier Interactive Patient Education  2018 Elsevier Inc.  

## 2017-06-01 NOTE — Progress Notes (Signed)
Patient ID: Virginia Grant, female   DOB: [birth year], 42 y.o.   MRN: [id number]    Subjective:  I acted as a Neurosurgeonscribe for Dr. Zola ButtonLowne-Chase.  Apolonio SchneidersSheketia, CMA   Patient ID: Virginia Grant, female    DOB: [birth year], 42 y.o.   MRN: [id number]  Chief Complaint  Patient presents with  . Sore Throat    Sore Throat   This is a new problem. The current episode started yesterday. Associated symptoms include congestion and headaches. Pertinent negatives include no coughing, ear pain, hoarse voice, shortness of breath or vomiting. She has tried nothing for the symptoms.    Patient is in today for sore throat.   Patient Care Team: Sandford Craze'Sullivan, Melissa, NP as PCP - General (Internal Medicine)   Past Medical History:  Diagnosis Date  . Anxiety   . Depression   . Headache   . Hypertension   . Stroke [facility](HCC)     Past Surgical History:  Procedure Laterality Date  . ESSURE TUBAL LIGATION    . NO PAST SURGERIES    . TEE WITHOUT CARDIOVERSION N/A 01/26/2017   Procedure: TRANSESOPHAGEAL ECHOCARDIOGRAM (TEE);  Surgeon: Lewayne Buntingrenshaw, Brian S, MD;  Location: [facility];  Service: Cardiovascular;  Laterality: N/A;    Family History  Problem Relation Age of Onset  . Hypertension Mother   . Diabetes Father   . Hypertension Father   . Cancer Maternal Aunt        Colon  . Cancer Maternal Uncle        Colon   . Heart attack Maternal Uncle   . COPD Maternal Grandmother   . Heart disease Maternal Grandmother   . Stroke Paternal Grandfather   . Hypertension Brother   . Stroke Maternal Aunt     Social History   Socioeconomic History  . Marital status: Married    Spouse name: Thayer OhmChris   . Number of children: 1  . Years of education: 4216  . Highest education level: Not on file  Social Needs  . Financial resource strain: Not on file  . Food insecurity - worry: Not on file  . Food insecurity - inability: Not on file  . Transportation needs - medical: Not on file  . Transportation needs - non-medical:  Not on file  Occupational History    Employer: [employer]  Tobacco Use  . Smoking status: Former Smoker    Packs/day: 1.00    Years: 5.00    Pack years: 5.00    Types: Cigarettes    Last attempt to quit: 04/26/1995    Years since quitting: 22.1  . Smokeless tobacco: Never Used  Substance and Sexual Activity  . Alcohol use: Yes    Alcohol/week: 1.8 oz    Types: 1 Glasses of wine, 1 Cans of beer, 1 Shots of liquor per week    Comment: Rarely  . Drug use: No  . Sexual activity: Yes    Partners: Male  Other Topics Concern  . Not on file  Social History Narrative   Marital Status:  Married [name](Chris)    Children:  Son [name](Noah) born 2000   Pets: Dogs 2   Living Situation: Lives with husband and son.   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  She quit in 1997.     Alcohol Use:  Rarely   Drug Use:  None  Diet:  Regular   Exercise:  Limited; She was exercising up until December but has gotten off track. She was walking/running and was going to the [facility].     Hobbies:  Family              Outpatient Medications Prior to Visit  Medication Sig Dispense Refill  . atorvastatin (LIPITOR) 10 MG tablet TAKE 1 TABLET (10 MG TOTAL) BY MOUTH DAILY AT 6 PM. 30 tablet 11  . CVS ASPIRIN EC 325 MG EC tablet TAKE 1 TABLET BY MOUTH EVERY DAY 30 tablet 11  . ibuprofen (ADVIL,MOTRIN) 200 MG tablet Take 600 mg by mouth every 6 (six) hours as needed for headache (pain).    Marland Kitchen venlafaxine XR (EFFEXOR-XR) 150 MG 24 hr capsule Take 300 mg by mouth daily with breakfast.    . Vitamin D, Ergocalciferol, (DRISDOL) 50000 units CAPS capsule Take 1 capsule (50,000 Units total) by mouth every 7 (seven) days. 12 capsule 0   No facility-administered medications prior to visit.     No Known Allergies  Review of Systems  Constitutional: Negative for fever and malaise/fatigue.  HENT: Positive for congestion.  Negative for ear pain and hoarse voice.   Eyes: Negative for blurred vision.  Respiratory: Negative for cough and shortness of breath.   Cardiovascular: Negative for chest pain, palpitations and leg swelling.  Gastrointestinal: Negative for vomiting.  Musculoskeletal: Negative for back pain.  Skin: Negative for rash.  Neurological: Positive for headaches. Negative for loss of consciousness.       Objective:    Physical Exam  Constitutional: She is oriented to person, place, and time. She appears well-developed and well-nourished.  HENT:  Head: Normocephalic and atraumatic.  Mouth/Throat: Oropharyngeal exudate and posterior oropharyngeal erythema present.  Eyes: Conjunctivae and EOM are normal.  Neck: Normal range of motion. Neck supple. No JVD present. Carotid bruit is not present. No thyromegaly present.  Cardiovascular: Normal rate, regular rhythm and normal heart sounds.  No murmur heard. Pulmonary/Chest: Effort normal and breath sounds normal. No respiratory distress. She has no wheezes. She has no rales. She exhibits no tenderness.  Musculoskeletal: She exhibits no edema.  Neurological: She is alert and oriented to person, place, and time.  Psychiatric: She has a normal mood and affect.  Nursing note and vitals reviewed.   BP 108/66 (BP Location: Right Arm, Cuff Size: Large)   Pulse 89   Temp 98 F (36.7 C) (Oral)   Resp 16   Ht 5\' 6"  (1.676 m)   Wt 227 lb 6.4 oz (103.1 kg)   LMP 05/27/2017   SpO2 98%   BMI 36.70 kg/m  Wt Readings from Last 3 Encounters:  06/01/17 227 lb 6.4 oz (103.1 kg)  03/10/17 209 lb (94.8 kg)  02/27/17 215 lb 3.2 oz (97.6 kg)   BP Readings from Last 3 Encounters:  06/01/17 108/66  03/10/17 117/62  02/27/17 115/69     Immunization History  Administered Date(s) Administered  . Tdap 04/25/1998, 07/15/2013    Health Maintenance  Topic Date Due  . HIV Screening  07/18/1988  . PAP SMEAR  03/14/2016  . INFLUENZA VACCINE  02/13/2018  (Originally 11/23/2016)  . MAMMOGRAM  02/14/2018  . TETANUS/TDAP  07/16/2023    Lab Results  Component Value Date   WBC 7.8 01/11/2017   HGB 12.6 01/11/2017   HCT 38.3 01/11/2017   PLT 232 01/11/2017   GLUCOSE 113 (H) 01/11/2017   CHOL 148 01/10/2017   TRIG 94 01/10/2017  HDL 52 01/10/2017   LDLCALC 77 01/10/2017   NA 137 01/11/2017   K 4.1 01/11/2017   CL 107 01/11/2017   CREATININE 0.83 01/11/2017   BUN 9 01/11/2017   CO2 24 01/11/2017   TSH 1.64 03/10/2017   INR 1.00 01/09/2017   HGBA1C 4.8 01/10/2017    Lab Results  Component Value Date   TSH 1.64 03/10/2017   Lab Results  Component Value Date   WBC 7.8 01/11/2017   HGB 12.6 01/11/2017   HCT 38.3 01/11/2017   MCV 93.0 01/11/2017   PLT 232 01/11/2017   Lab Results  Component Value Date   NA 137 01/11/2017   K 4.1 01/11/2017   CO2 24 01/11/2017   GLUCOSE 113 (H) 01/11/2017   BUN 9 01/11/2017   CREATININE 0.83 01/11/2017   CALCIUM 8.7 (L) 01/11/2017   ANIONGAP 6 01/11/2017   Lab Results  Component Value Date   CHOL 148 01/10/2017   Lab Results  Component Value Date   HDL 52 01/10/2017   Lab Results  Component Value Date   LDLCALC 77 01/10/2017   Lab Results  Component Value Date   TRIG 94 01/10/2017   Lab Results  Component Value Date   CHOLHDL 2.8 01/10/2017   Lab Results  Component Value Date   HGBA1C 4.8 01/10/2017         Assessment & Plan:   Problem List Items Addressed This Visit    None    Visit Diagnoses    Sore throat    -  Primary   Relevant Medications   amoxicillin (AMOXIL) 875 MG tablet   Other Relevant Orders   POCT rapid strep A (Completed)   Culture, Group A Strep    clinically strep--- will send off culture abx per orders Drink plenty of fluids Tylenol/ advil for pain/ fever rto prn   I am having Thornton Papas. Arroyave start on amoxicillin. I am also having her maintain her venlafaxine XR, ibuprofen, atorvastatin, CVS ASPIRIN EC, and Vitamin D  (Ergocalciferol).  Meds ordered this encounter  Medications  . amoxicillin (AMOXIL) 875 MG tablet    Sig: Take 1 tablet (875 mg total) by mouth 2 (two) times daily.    Dispense:  20 tablet    Refill:  0    CMA served as scribe during this visit. History, Physical and Plan performed by medical provider. Documentation and orders reviewed and attested to.  Donato Schultz, DO \

## 2017-06-02 LAB — CULTURE, GROUP A STREP
MICRO NUMBER: 90166487
SPECIMEN QUALITY: ADEQUATE

## 2017-06-05 ENCOUNTER — Encounter: Payer: Self-pay | Admitting: Family

## 2017-06-05 ENCOUNTER — Ambulatory Visit: Payer: BC Managed Care – PPO | Admitting: Family

## 2017-06-05 VITALS — BP 122/59 | HR 67 | Temp 98.4°F | Resp 16 | Ht 66.0 in | Wt 226.8 lb

## 2017-06-05 DIAGNOSIS — F32A Depression, unspecified: Secondary | ICD-10-CM

## 2017-06-05 DIAGNOSIS — E559 Vitamin D deficiency, unspecified: Secondary | ICD-10-CM | POA: Diagnosis not present

## 2017-06-05 DIAGNOSIS — R635 Abnormal weight gain: Secondary | ICD-10-CM

## 2017-06-05 DIAGNOSIS — E538 Deficiency of other specified B group vitamins: Secondary | ICD-10-CM | POA: Diagnosis not present

## 2017-06-05 DIAGNOSIS — F329 Major depressive disorder, single episode, unspecified: Secondary | ICD-10-CM

## 2017-06-05 DIAGNOSIS — Z8673 Personal history of transient ischemic attack (TIA), and cerebral infarction without residual deficits: Secondary | ICD-10-CM

## 2017-06-05 DIAGNOSIS — F418 Other specified anxiety disorders: Secondary | ICD-10-CM | POA: Diagnosis not present

## 2017-06-05 NOTE — Patient Instructions (Signed)
Please complete lab work prior to leaving. Continue to follow monthly for b12 injections.

## 2017-06-05 NOTE — Progress Notes (Signed)
Subjective:    Patient ID: Virginia Grant, female    DOB: 1975-07-14, 42 y.o.   MRN: [id number]  HPI    Reports that she is not exercising.  Some days her diet is good other days it is not.   She has had weight gain of the last several months. She has separated from her husband. Wt Readings from Last 3 Encounters:  06/05/17 226 lb 12.8 oz (102.9 kg)  06/01/17 227 lb 6.4 oz (103.1 kg)  03/10/17 209 lb (94.8 kg)   CVA- reports that she was overwhelmed in the beginning upon returning to work but it is improving. Reports that strength and coordination continue to improve.   Anxiety/Depression- She continues with Vear ClockLeslie O'Neil NP for psychiatry. She is maintained on effexor, mood stable.     Review of Systems See HPI  Past Medical History:  Diagnosis Date  . Anxiety   . Depression   . Headache   . Hypertension   . Stroke [facility](HCC)      Social History   Socioeconomic History  . Marital status: Married    Spouse name: Thayer OhmChris   . Number of children: 1  . Years of education: 5716  . Highest education level: Not on file  Social Needs  . Financial resource strain: Not on file  . Food insecurity - worry: Not on file  . Food insecurity - inability: Not on file  . Transportation needs - medical: Not on file  . Transportation needs - non-medical: Not on file  Occupational History    Employer: [employer]  Tobacco Use  . Smoking status: Former Smoker    Packs/day: 1.00    Years: 5.00    Pack years: 5.00    Types: Cigarettes    Last attempt to quit: 04/26/1995    Years since quitting: 22.1  . Smokeless tobacco: Never Used  Substance and Sexual Activity  . Alcohol use: Yes    Alcohol/week: 1.8 oz    Types: 1 Glasses of wine, 1 Cans of beer, 1 Shots of liquor per week    Comment: Rarely  . Drug use: No  . Sexual activity: Yes    Partners: Male  Other Topics Concern  . Not on file  Social History Narrative   Marital Status:  Married [name](Chris)    Children:  Son [name](Noah)  born 2000   Pets: Dogs 2   Living Situation: Lives with husband and son.   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  She quit in 1997.     Alcohol Use:  Rarely   Drug Use:  None   Diet:  Regular   Exercise:  Limited; She was exercising up until December but has gotten off track. She was walking/running and was going to the [facility].     Hobbies:  Family              Past Surgical History:  Procedure Laterality Date  . ESSURE TUBAL LIGATION    . NO PAST SURGERIES    . TEE WITHOUT CARDIOVERSION N/A 01/26/2017   Procedure: TRANSESOPHAGEAL ECHOCARDIOGRAM (TEE);  Surgeon: Lewayne Buntingrenshaw, Brian S, MD;  [facility];  Service: Cardiovascular;  Laterality: N/A;    Family History  Problem Relation Age of Onset  . Hypertension Mother   . Diabetes Father   . Hypertension Father   . Cancer Maternal  Aunt        Colon  . Cancer Maternal Uncle        Colon   . Heart attack Maternal Uncle   . COPD Maternal Grandmother   . Heart disease Maternal Grandmother   . Stroke Paternal Grandfather   . Hypertension Brother   . Stroke Maternal Aunt     No Known Allergies  Current Outpatient Medications on File Prior to Visit  Medication Sig Dispense Refill  . amoxicillin (AMOXIL) 875 MG tablet Take 1 tablet (875 mg total) by mouth 2 (two) times daily. 20 tablet 0  . atorvastatin (LIPITOR) 10 MG tablet TAKE 1 TABLET (10 MG TOTAL) BY MOUTH DAILY AT 6 PM. 30 tablet 11  . CVS ASPIRIN EC 325 MG EC tablet TAKE 1 TABLET BY MOUTH EVERY DAY 30 tablet 11  . ibuprofen (ADVIL,MOTRIN) 200 MG tablet Take 600 mg by mouth every 6 (six) hours as needed for headache (pain).    Marland Kitchen venlafaxine XR (EFFEXOR-XR) 150 MG 24 hr capsule Take 300 mg by mouth daily with breakfast.    . Vitamin D, Ergocalciferol, (DRISDOL) 50000 units CAPS capsule Take 1 capsule (50,000 Units total) by mouth every 7 (seven) days. 12 capsule 0    No current facility-administered medications on file prior to visit.     BP (!) 122/59 (BP Location: Right Arm, Patient Position: Sitting, Cuff Size: Large)   Pulse 67   Temp 98.4 F (36.9 C) (Oral)   Resp 16   Ht 5\' 6"  (1.676 m)   Wt 226 lb 12.8 oz (102.9 kg)   LMP 05/27/2017   SpO2 100%   BMI 36.61 kg/m       Objective:   Physical Exam  Constitutional: She is oriented to person, place, and time. She appears well-developed and well-nourished.  HENT:  Head: Normocephalic and atraumatic.  Cardiovascular: Normal rate, regular rhythm and normal heart sounds.  No murmur heard. Pulmonary/Chest: Effort normal and breath sounds normal. No respiratory distress. She has no wheezes.  Musculoskeletal: She exhibits no edema.  Neurological: She is alert and oriented to person, place, and time. She exhibits normal muscle tone. Coordination normal.  Psychiatric: She has a normal mood and affect. Her behavior is normal. Judgment and thought content normal.          Assessment & Plan:  B12 deficiency-continues monthly B12 injections.  History of CVA- she continues to improve.  She is now back at work.  Continue aspirin and statin for secondary stroke prevention.  Depression/anxiety-this is well controlled she is currently followed by psychiatry.  Weight gain-we discussed healthy diet and exercise.  Will obtain TSH.  Vitamin D deficiency.  Continues vitamin D supplement.  Will obtain follow-up level.

## 2017-06-06 LAB — TSH: TSH: 1.92 u[IU]/mL (ref 0.35–4.50)

## 2017-06-06 LAB — VITAMIN B12: Vitamin B-12: 243 pg/mL (ref 211–911)

## 2017-06-10 LAB — VITAMIN D 1,25 DIHYDROXY
VITAMIN D 1, 25 (OH) TOTAL: 36 pg/mL (ref 18–72)
VITAMIN D3 1, 25 (OH): 9 pg/mL
Vitamin D2 1, 25 (OH)2: 27 pg/mL

## 2017-06-13 ENCOUNTER — Telehealth: Payer: Self-pay | Admitting: Family

## 2017-06-13 MED ORDER — VITAMIN D 1000 UNITS PO TABS
3000.0000 [IU] | ORAL_TABLET | Freq: Every day | ORAL | Status: AC
Start: 2017-06-13 — End: ?

## 2017-06-13 NOTE — Telephone Encounter (Signed)
See my chart message

## 2017-06-22 ENCOUNTER — Ambulatory Visit (INDEPENDENT_AMBULATORY_CARE_PROVIDER_SITE_OTHER): Payer: BC Managed Care – PPO

## 2017-06-22 DIAGNOSIS — E538 Deficiency of other specified B group vitamins: Secondary | ICD-10-CM

## 2017-06-22 MED ORDER — CYANOCOBALAMIN 1000 MCG/ML IJ SOLN
1000.0000 ug | Freq: Once | INTRAMUSCULAR | Status: AC
  Administered 2017-06-22: 1000 ug via INTRAMUSCULAR

## 2017-06-22 MED ORDER — CYANOCOBALAMIN 1000 MCG/ML IJ SOLN: 1000.0000 ug | Freq: Once | INTRAMUSCULAR | Status: DC

## 2017-06-22 NOTE — Progress Notes (Signed)
Pre visit review using our clinic review tool, if applicable. No additional management support is needed unless otherwise documented below in the visit note.  Pt here today for B12 injection. 1mL injected into L deltoid. Pt tolerated injection well.   Pt to return in 4 weeks for next B12 shot. Nurse visit scheduled for 07/20/2017.

## 2017-06-24 NOTE — Progress Notes (Signed)
Noted and agree.  Dariana Garbett S O'Sullivan NP 

## 2017-06-27 ENCOUNTER — Encounter: Payer: Self-pay | Admitting: Family

## 2017-06-27 ENCOUNTER — Ambulatory Visit: Payer: BC Managed Care – PPO | Admitting: Family

## 2017-06-27 VITALS — BP 116/72 | HR 80 | Temp 97.9°F | Resp 16 | Ht 66.0 in | Wt 231.6 lb

## 2017-06-27 DIAGNOSIS — Z8673 Personal history of transient ischemic attack (TIA), and cerebral infarction without residual deficits: Secondary | ICD-10-CM | POA: Diagnosis not present

## 2017-06-27 DIAGNOSIS — R5383 Other fatigue: Secondary | ICD-10-CM

## 2017-06-27 DIAGNOSIS — F418 Other specified anxiety disorders: Secondary | ICD-10-CM

## 2017-06-27 LAB — CBC WITH DIFFERENTIAL/PLATELET
Basophils Absolute: 0.1 10*3/uL (ref 0.0–0.1)
Basophils Relative: 1 % (ref 0.0–3.0)
EOS ABS: 0.3 10*3/uL (ref 0.0–0.7)
Eosinophils Relative: 4.2 % (ref 0.0–5.0)
HCT: 40.7 % (ref 36.0–46.0)
Hemoglobin: 13.8 g/dL (ref 12.0–15.0)
LYMPHS ABS: 1.6 10*3/uL (ref 0.7–4.0)
Lymphocytes Relative: 20 % (ref 12.0–46.0)
MCHC: 34 g/dL (ref 30.0–36.0)
MCV: 93.2 fl (ref 78.0–100.0)
MONO ABS: 0.4 10*3/uL (ref 0.1–1.0)
Monocytes Relative: 4.5 % (ref 3.0–12.0)
NEUTROS PCT: 70.3 % (ref 43.0–77.0)
Neutro Abs: 5.7 10*3/uL (ref 1.4–7.7)
Platelets: 258 10*3/uL (ref 150.0–400.0)
RBC: 4.37 Mil/uL (ref 3.87–5.11)
RDW: 12.5 % (ref 11.5–15.5)
WBC: 8.1 10*3/uL (ref 4.0–10.5)

## 2017-06-27 LAB — COMPREHENSIVE METABOLIC PANEL
ALT: 38 U/L — ABNORMAL HIGH (ref 0–35)
AST: 20 U/L (ref 0–37)
Albumin: 4.2 g/dL (ref 3.5–5.2)
Alkaline Phosphatase: 75 U/L (ref 39–117)
BILIRUBIN TOTAL: 0.5 mg/dL (ref 0.2–1.2)
BUN: 15 mg/dL (ref 6–23)
CALCIUM: 9.3 mg/dL (ref 8.4–10.5)
CO2: 26 meq/L (ref 19–32)
Chloride: 102 mEq/L (ref 96–112)
Creatinine, Ser: 0.74 mg/dL (ref 0.40–1.20)
GFR: 90.64 mL/min (ref >60.00)
GLUCOSE: 103 mg/dL — AB (ref 70–99)
POTASSIUM: 3.9 meq/L (ref 3.5–5.1)
Sodium: 135 mEq/L (ref 135–145)
Total Protein: 7.4 g/dL (ref 6.0–8.3)

## 2017-06-27 LAB — TSH: TSH: 2.88 u[IU]/mL (ref 0.35–4.50)

## 2017-06-27 MED ORDER — FLUOXETINE HCL 10 MG PO CAPS: 10.0000 mg | ORAL_CAPSULE | Freq: Every day | ORAL | 3 refills | Status: DC

## 2017-06-27 MED ORDER — VENLAFAXINE HCL ER 150 MG PO CP24: 150.0000 mg | ORAL_CAPSULE | Freq: Every day | ORAL | Status: DC

## 2017-06-27 MED ORDER — VENLAFAXINE HCL ER 75 MG PO CP24: 75.0000 mg | ORAL_CAPSULE | Freq: Every day | ORAL | 5 refills | Status: DC

## 2017-06-27 NOTE — Progress Notes (Signed)
Subjective:    Patient ID: Virginia Grant, female    DOB: 09/20/1975, 42 y.o.   MRN: [id number]  HPI  Virginia Grant is a 42 yr old female who presents today with chief complaint of fatigue.Reports work has been stressful.  Has had 3 new very challenging students.  Reports that she has been having a breakdown during the day. Reports that she is very tired when she gets home  Did not get up until 2:30 PM on Saturday AM.    She reports that when she gets overwhelmed and stressed she feels like her weakness/clumsiness on the left side.  If she is really stressed her speech will slur and she has some tingling.  + stress eating. Started a stress detox yesterday.  Feels like mood is low.      Review of Systems See HPI  Past Medical History:  Diagnosis Date  . Anxiety   . Depression   . Headache   . Hypertension   . Stroke [facility](HCC)      Social History   Socioeconomic History  . Marital status: Married    Spouse name: Thayer OhmChris   . Number of children: 1  . Years of education: 5516  . Highest education level: Not on file  Social Needs  . Financial resource strain: Not on file  . Food insecurity - worry: Not on file  . Food insecurity - inability: Not on file  . Transportation needs - medical: Not on file  . Transportation needs - non-medical: Not on file  Occupational History    Employer: [employer]  Tobacco Use  . Smoking status: Former Smoker    Packs/day: 1.00    Years: 5.00    Pack years: 5.00    Types: Cigarettes    Last attempt to quit: 04/26/1995    Years since quitting: 22.1  . Smokeless tobacco: Never Used  Substance and Sexual Activity  . Alcohol use: Yes    Alcohol/week: 1.8 oz    Types: 1 Glasses of wine, 1 Cans of beer, 1 Shots of liquor per week    Comment: Rarely  . Drug use: No  . Sexual activity: Yes    Partners: Male  Other Topics Concern  . Not on file  Social History Narrative   Marital Status:  Married [name](Chris)    Children:  Son [name](Noah) born 2000   Pets: Dogs 2   Living Situation: Lives with husband and son.   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  She quit in 1997.     Alcohol Use:  Rarely   Drug Use:  None   Diet:  Regular   Exercise:  Limited; She was exercising up until December but has gotten off track. She was walking/running and was [facility] the [facility].     Hobbies:  Family              Past Surgical History:  Procedure Laterality Date  . ESSURE TUBAL LIGATION    . NO PAST SURGERIES    . TEE WITHOUT CARDIOVERSION N/A 01/26/2017   Procedure: TRANSESOPHAGEAL ECHOCARDIOGRAM (TEE);  Surgeon: Lewayne Buntingrenshaw, Brian S, MD;  [facility];  Service: Cardiovascular;  Laterality: N/A;    Family History  Problem Relation Age of Onset  . Hypertension Mother   . Diabetes Father   . Hypertension Father   . Cancer  Maternal Aunt        Colon  . Cancer Maternal Uncle        Colon   . Heart attack Maternal Uncle   . COPD Maternal Grandmother   . Heart disease Maternal Grandmother   . Stroke Paternal Grandfather   . Hypertension Brother   . Stroke Maternal Aunt     No Known Allergies  Current Outpatient Medications on File Prior to Visit  Medication Sig Dispense Refill  . atorvastatin (LIPITOR) 10 MG tablet TAKE 1 TABLET (10 MG TOTAL) BY MOUTH DAILY AT 6 PM. 30 tablet 11  . cholecalciferol (VITAMIN D) 1000 units tablet Take 3 tablets (3,000 Units total) by mouth daily.    . CVS ASPIRIN EC 325 MG EC tablet TAKE 1 TABLET BY MOUTH EVERY DAY 30 tablet 11  . ibuprofen (ADVIL,MOTRIN) 200 MG tablet Take 600 mg by mouth every 6 (six) hours as needed for headache (pain).    Marland Kitchen venlafaxine XR (EFFEXOR-XR) 150 MG 24 hr capsule Take 300 mg by mouth daily with breakfast.     No current facility-administered medications on file prior to visit.     BP 116/72 (BP Location: Right Arm, Patient Position: Sitting, Cuff Size: Large)   Pulse  80   Temp 97.9 F (36.6 C) (Oral)   Resp 16   Ht 5\' 6"  (1.676 m)   Wt 231 lb 9.6 oz (105.1 kg)   SpO2 100%   BMI 37.38 kg/m       Objective:   Physical Exam  Constitutional: She is oriented to person, place, and time. She appears well-developed and well-nourished.  HENT:  Head: Normocephalic and atraumatic.  Eyes: EOM are normal. Pupils are equal, round, and reactive to light.  Cardiovascular: Normal rate, regular rhythm and normal heart sounds.  No murmur heard. Pulmonary/Chest: Effort normal and breath sounds normal. No respiratory distress. She has no wheezes.  Neurological: She is alert and oriented to person, place, and time. No cranial nerve deficit. She exhibits normal muscle tone.  Psychiatric: Her behavior is normal. Judgment and thought content normal.  Flat affect, tearful          Assessment & Plan:  Depression/anxiety- deteriorated.  Case reviewed with Dr. Abner Greenspan.  Will decrease effexor from 300mg  to 225mg  once daily. Add prozac, continue work with Veterinary surgeon. I have written her out of work for the next 2 weeks.   Fatigue- check CBC, CMET, TSH to evaluate for underlying medical cause for her fatigue.   Hx of CVA- neuro intact at today' exam.  Monitor.

## 2017-06-27 NOTE — Patient Instructions (Signed)
Change effexor to 225mg /day.  (150mg  + 75mg  tab). Begin prozac 10mg  once daily.  Continue your work with Photographeryour counselor.

## 2017-07-11 ENCOUNTER — Encounter: Payer: Self-pay | Admitting: Family

## 2017-07-11 ENCOUNTER — Ambulatory Visit (INDEPENDENT_AMBULATORY_CARE_PROVIDER_SITE_OTHER): Payer: BC Managed Care – PPO | Admitting: Family

## 2017-07-11 ENCOUNTER — Other Ambulatory Visit (HOSPITAL_COMMUNITY)
Admission: RE | Admit: 2017-07-11 | Discharge: 2017-07-11 | Disposition: A | Discharge status: Home / Self Care | Payer: BC Managed Care – PPO | Source: Ambulatory Visit | Attending: Family | Admitting: Family

## 2017-07-11 VITALS — BP 120/59 | HR 80 | Temp 98.7°F | Resp 16 | Ht 66.0 in | Wt 234.4 lb

## 2017-07-11 DIAGNOSIS — F32A Depression, unspecified: Secondary | ICD-10-CM

## 2017-07-11 DIAGNOSIS — Z113 Encounter for screening for infections with a predominantly sexual mode of transmission: Secondary | ICD-10-CM | POA: Diagnosis present

## 2017-07-11 DIAGNOSIS — F419 Anxiety disorder, unspecified: Secondary | ICD-10-CM

## 2017-07-11 DIAGNOSIS — F418 Other specified anxiety disorders: Secondary | ICD-10-CM

## 2017-07-11 DIAGNOSIS — F329 Major depressive disorder, single episode, unspecified: Secondary | ICD-10-CM

## 2017-07-11 NOTE — Patient Instructions (Signed)
Please complete lab work prior to leaving. Continue your work with Photographeryour counselor.

## 2017-07-11 NOTE — Progress Notes (Signed)
Subjective:    Patient ID: Virginia Grant, female    DOB: [birth year], 42 y.o.   MRN: [id number]  HPI Patient is a 42 year old female who presents today for follow-up.  Last visit she described increased stress at work.  She reported that she was having intermittent emotional breakdowns during the day.  She was also having significant fatigue.  We decreased her Effexor from 300 down to 225 mg once daily and added Prozac to her regimen.  CBC and TSH were within normal limits.  Complete metabolic panel was unremarkable with the exception of a glucose of 103 and a very mildly elevated ALT at 38.   She reports that she has had some really good therapy sessions.  She reports that she was afraid to change her medications because she has had a lot of weight gain in the past on antidepressants and she was afraid about withdrawal symptoms coming down the Effexor.  She has continued Effexor 300 mg once daily which was initially prescribed by psychiatry at this dose.  Review of Systems See HPI  Past Medical History:  Diagnosis Date  . Anxiety   . Depression   . Headache   . Hypertension   . Stroke [facility](HCC)      Social History   Socioeconomic History  . Marital status: Married    Spouse name: Thayer OhmChris   . Number of children: 1  . Years of education: 5316  . Highest education level: Not on file  Social Needs  . Financial resource strain: Not on file  . Food insecurity - worry: Not on file  . Food insecurity - inability: Not on file  . Transportation needs - medical: Not on file  . Transportation needs - non-medical: Not on file  Occupational History    Employer: [employer]  Tobacco Use  . Smoking status: Former Smoker    Packs/day: 1.00    Years: 5.00    Pack years: 5.00    Types: Cigarettes    Last attempt to quit: 04/26/1995    Years since quitting: 22.2  . Smokeless tobacco: Never Used  Substance and Sexual Activity  . Alcohol use: Yes    Alcohol/week: 1.8 oz    Types: 1  Glasses of wine, 1 Cans of beer, 1 Shots of liquor per week    Comment: Rarely  . Drug use: No  . Sexual activity: Yes    Partners: Male  Other Topics Concern  . Not on file  Social History Narrative   Marital Status:  Married [name](Chris)    Children:  Son [name](Noah) born 2000   Pets: Dogs 2   Living Situation: Lives with husband and son.   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  She quit in 1997.     Alcohol Use:  Rarely   Drug Use:  None   Diet:  Regular   Exercise:  Limited; She was exercising up until December but has gotten off track. She was walking/running and was going to the [facility].     Hobbies:  Family              Past Surgical History:  Procedure Laterality Date  . ESSURE TUBAL LIGATION    . NO PAST SURGERIES    . TEE WITHOUT CARDIOVERSION N/A 01/26/2017   Procedure: TRANSESOPHAGEAL ECHOCARDIOGRAM (TEE);  Surgeon: Lewayne Buntingrenshaw, Brian S, MD;  Location: [facility] ENDOSCOPY;  Service: Cardiovascular;  Laterality: N/A;    Family History  Problem Relation Age of Onset  . Hypertension Mother   . Diabetes Father   . Hypertension Father   . Cancer Maternal Aunt        Colon  . Cancer Maternal Uncle        Colon   . Heart attack Maternal Uncle   . COPD Maternal Grandmother   . Heart disease Maternal Grandmother   . Stroke Paternal Grandfather   . Hypertension Brother   . Stroke Maternal Aunt     No Known Allergies  Current Outpatient Medications on File Prior to Visit  Medication Sig Dispense Refill  . atorvastatin (LIPITOR) 10 MG tablet TAKE 1 TABLET (10 MG TOTAL) BY MOUTH DAILY AT 6 PM. 30 tablet 11  . cholecalciferol (VITAMIN D) 1000 units tablet Take 3 tablets (3,000 Units total) by mouth daily.    . CVS ASPIRIN EC 325 MG EC tablet TAKE 1 TABLET BY MOUTH EVERY DAY 30 tablet 11  . FLUoxetine (PROZAC) 10 MG capsule Take 1 capsule (10 mg total) by mouth daily. 30 capsule 3  .  ibuprofen (ADVIL,MOTRIN) 200 MG tablet Take 600 mg by mouth every 6 (six) hours as needed for headache (pain).    Marland Kitchen venlafaxine XR (EFFEXOR XR) 75 MG 24 hr capsule Take 1 capsule (75 mg total) by mouth daily with breakfast. 30 capsule 5  . venlafaxine XR (EFFEXOR-XR) 150 MG 24 hr capsule Take 1 capsule (150 mg total) by mouth daily with breakfast.     No current facility-administered medications on file prior to visit.     BP (!) 120/59 (BP Location: Right Arm, Patient Position: Sitting, Cuff Size: Large)   Pulse 80   Temp 98.7 F (37.1 C) (Oral)   Resp 16   Ht 5\' 6"  (1.676 m)   Wt 234 lb 6.4 oz (106.3 kg)   LMP 07/09/2017   SpO2 100%   BMI 37.83 kg/m       Objective:   Physical Exam  Constitutional: She is oriented to person, place, and time. She appears well-developed and well-nourished. No distress.  Neurological: She is alert and oriented to person, place, and time.  Psychiatric:  Patient became briefly tearful today when she requested STD screening.  Otherwise she is calm and pleasant.          Assessment & Plan:  STD screening-patient requests STD screening today.  She reports this is due to concern over exposure through her husband with whom she recently separated.  She is asymptomatic.  Anxiety/depression she is clinically improved.  She is continuing to work with a Veterinary surgeon.  She does not wish to change her medication at this time.  She wishes to return to work on Thursday.  At this time I have advised her that we will continue her Effexor at current dose but ultimately we really should try to get her down to at least the 225 mg dosing.  She will follow-up in 3 months and at that time we can consider slowly titrating her downward.  She is agreeable to this plan.  We did discuss her mild elevation of her ALT as well as her glucose.  Advised her that the most likely cause of her elevation of ALT is fatty liver.  We will continue to monitor this and I have advised her to  work on healthy diet exercise and weight loss.  Her weight back in September at the  time of her stroke was 216 pounds.  Her weight today is 234 pounds.

## 2017-07-12 ENCOUNTER — Encounter: Payer: Self-pay | Admitting: Family

## 2017-07-12 LAB — URINE CYTOLOGY ANCILLARY ONLY
CHLAMYDIA, DNA PROBE: NEGATIVE
Neisseria Gonorrhea: NEGATIVE
Trichomonas: NEGATIVE

## 2017-07-12 LAB — HIV ANTIBODY (ROUTINE TESTING W REFLEX): HIV: NONREACTIVE

## 2017-07-12 LAB — HSV 2 ANTIBODY, IGG: HSV 2 Glycoprotein G Ab, IgG: <0.9 index

## 2017-07-12 LAB — RPR: RPR: NONREACTIVE

## 2017-07-20 ENCOUNTER — Ambulatory Visit: Payer: BC Managed Care – PPO

## 2017-07-20 DIAGNOSIS — E538 Deficiency of other specified B group vitamins: Secondary | ICD-10-CM

## 2017-07-20 MED ORDER — CYANOCOBALAMIN 1000 MCG/ML IJ SOLN: 1000.0000 ug | Freq: Once | INTRAMUSCULAR | Status: DC

## 2017-07-20 NOTE — Progress Notes (Signed)
Patient came in for her monthly B-12 injections   She tolerated 1mL in her L deltoid

## 2017-08-23 ENCOUNTER — Ambulatory Visit (INDEPENDENT_AMBULATORY_CARE_PROVIDER_SITE_OTHER): Payer: BC Managed Care – PPO

## 2017-08-23 DIAGNOSIS — E538 Deficiency of other specified B group vitamins: Secondary | ICD-10-CM | POA: Diagnosis not present

## 2017-08-23 MED ORDER — CYANOCOBALAMIN 1000 MCG/ML IJ SOLN
1000.0000 ug | Freq: Once | INTRAMUSCULAR | Status: AC
  Administered 2017-08-23: 1000 ug via INTRAMUSCULAR

## 2017-08-23 NOTE — Progress Notes (Signed)
Pt here for monthly B12 injection. B12 injection given IM into R deltoidby Pearletha Furl, MA;  pt tolerated well. Next B12 injection scheduled for 09/27/17 at 4PM with nurse.

## 2017-08-24 NOTE — Progress Notes (Signed)
Reviewed/agree

## 2017-08-24 NOTE — Progress Notes (Signed)
[facility]  PATIENT: Virginia Grant DOB: 01-Jul-1975   REASON FOR VISIT: Follow-up for stroke in September 2018 HISTORY FROM: Patient alone at visit    HISTORY OF PRESENT ILLNESS: 02/27/17 Virginia Grant is a pleasant 23 year Caucasian lady seen today for the first office follow-up visit after admission for stroke in September 2018.  History is obtained from the patient and review of electronic medical records and I have personally reviewed imaging films.She has a past medical history significant for anxiety and depression, brought from [facility] at [facility] for further workup regarding strokelike symptoms. In review, the patient was in her usual state of health until about 01/07/17, when she noted some drooping in the left lower side of her face, without any sensory deficits. The patient thought at the time to have Bell's palsy, for which she did not seek medical attention. This morning, the patient noted slurred speech, with an resolving left facial droop, left arm numbness and tingling, and mild left lower extremity weakness. Patient never had a similar episode. Denied any history of TIA. Denies vertigo dizziness or vision changes. Denied headaches No confusion or seizures. Denied any chest pain, or shortness of breath.,any fever or chills, night sweats. any neck pain, urine retention or incontinence. No tobacco abuse. No new meds or hormonal supplements. Did nottake a regular ASA a day, ot other antiplatelets or anticoagulants.Denied any recent long distance trips or recent surgeries. No sick contacts. Denied diabetes or multiple sclerosishistory. Family history of stroke in 2 aunts.1 sister with history of multiple miscarriages.Patient was not administered TPA as is beyond time window for treatment consideration.  MRI scan of the brain showed a large 2 [place] acute infarct in the posterior limb internal capsule and deep white matter.  CT angiogram of the brain and neck showed  no large vessel extracranial or intracranial stenosis.  LDL cholesterol was slightly elevated at 77 mg percent and hemoglobin A1c was 4.8.  Transcranial Doppler bubble study showed no evidence of right-to-left intracardiac shunt.  Transthoracic echo and transesophageal echo both did not show any definite evidence of cardiac source of embolism.  Patient's vascular risk factors identified included borderline hypertension, hyperlipidemia and mild obesity.  She was started on aspirin for stroke prevention and Lipitor.  She states she is tolerating both medications well without any bruising, bleeding, upset stomach or muscle aches or pains.  She is participating in outpatient physical therapy and she states her facial droop as well as hand strength is improving.  She still drags her left foot a little bit when tired.  A fine motor skills and dexterity is also not yet back to normal.  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  She has no prior history of deep vein thrombosis, pulmonary embolism, heart attacks or any family history of strokes or heart attacks at a young age.  She had lab work for ESR, ANA panel and antiphospholipid antibodies all of which was negative. UPDATE 5/6/2019CM [name], 42 year old female returns for follow-up with history of stroke in September 2018.  Her physical therapy has concluded , her facial droop has cleared.  She has a new complaint of dizziness since Saturday, she has seasonal allergies.  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  She is currently on aspirin for secondary stroke prevention without recurrent stroke or TIA symptoms.  She is also on Lipitor without myalgias.  She gets no exercise and was encouraged to do so.   She is going to a Social worker.  She is on Effexor.  She is crying often during her exam today REVIEW OF SYSTEMS: Full 14 system review of systems performed and notable only for those listed, all others are neg:  Constitutional: Fatigue Cardiovascular: neg Ear/Nose/Throat: neg  Skin: neg Eyes: Blurred vision Respiratory: neg Gastroitestinal: neg  Hematology/Lymphatic: neg  Endocrine: neg Musculoskeletal:neg Allergy/Immunology: neg Neurological: Memory loss headache numbness weakness Psychiatric: Depression anxiety Slee p  ALLERGIES: No Known Allergies  HOME MEDICATIONS: Outpatient Medications Prior to Visit  Medication Sig Dispense Refill  . atorvastatin (LIPITOR) 10 MG tablet TAKE 1 TABLET (10 MG TOTAL) BY MOUTH DAILY AT 6 PM. 30 tablet 11  . cholecalciferol (VITAMIN D) 1000 units tablet Take 3 tablets (3,000 Units total) by mouth daily.    . [facility] ASPIRIN EC 325 MG EC tablet TAKE 1 TABLET BY MOUTH EVERY DAY 30 tablet 11  . ibuprofen (ADVIL,MOTRIN) 200 MG tablet Take 600 mg by mouth every 6 (six) hours as needed for headache (pain).    . LORazepam (ATIVAN) 0.5 MG tablet TAKE 1/4 TABLET DAILY AS NEEDED  0  . venlafaxine XR (EFFEXOR-XR) 150 MG 24 hr capsule Take 300 mg by mouth daily with breakfast.    . FLUoxetine (PROZAC) 10 MG capsule Take 1 capsule (10 mg total) by mouth daily. 30 capsule 3   No facility-administered medications prior to visit.     PAST MEDICAL HISTORY: Past Medical History:  Diagnosis Date  . Anxiety   . Depression   . Headache   . Hypertension   . Stroke [facility]) 12/2016    PAST SURGICAL HISTORY: Past Surgical History:  Procedure Laterality Date  . ESSURE TUBAL LIGATION    . NO PAST SURGERIES    . TEE WITHOUT CARDIOVERSION N/A 01/26/2017   Procedure: TRANSESOPHAGEAL ECHOCARDIOGRAM (TEE);  Surgeon: Lelon Perla, MD;  Location: [facility] ENDOSCOPY;  Service: Cardiovascular;  Laterality: N/A;    FAMILY HISTORY: Family History  Problem Relation Age of  Onset  . Hypertension Mother   . Diabetes Father   . Hypertension Father   . Cancer Maternal Aunt        Colon  . Cancer Maternal Uncle        Colon   . Heart attack Maternal Uncle   . COPD Maternal Grandmother   . Heart disease Maternal Grandmother   . Stroke Paternal Grandfather   . Hypertension Brother   . Stroke Maternal Aunt     SOCIAL HISTORY: Social History   Socioeconomic History  . Marital status: Married    Spouse name: Gerald Stabs   . Number of children: 1  . Years of education: 43  . Highest education level: Not on file  Occupational History    Employer: [employer]  Social Needs  . Financial resource strain: Not on file  . Food insecurity:    Worry: Not on file    Inability: Not on file  . Transportation needs:    Medical: Not on file    Non-medical: Not on file  Tobacco Use  . Smoking status: Former Smoker    Packs/day: 1.00    Years: 5.00    Pack years: 5.00    Types: Cigarettes    Last attempt  to quit: 04/26/1995    Years since quitting: 22.3  . Smokeless tobacco: Never Used  Substance and Sexual Activity  . Alcohol use: Yes    Alcohol/week: 1.8 oz    Types: 1 Glasses of wine, 1 Cans of beer, 1 Shots of liquor per week    Comment: Rarely  . Drug use: No  . Sexual activity: Yes    Partners: Male  Lifestyle  . Physical activity:    Days per week: 1 day    Minutes per session: 60 min  . Stress: Rather much  Relationships  . Social connections:    Talks on phone: More than three times a week    Gets together: Once a week    Attends religious service: Never    Active member of club or organization: No    Attends meetings of clubs or organizations: Never    Relationship status: Married  . Intimate partner violence:    Fear of current or ex partner: No    Emotionally abused: No    Physically abused: No    Forced sexual activity: No  Other Topics Concern  . Not on file  Social History Narrative   Marital Status:  Married Gerald Stabs)     Children:  Son Olen Cordial) born 2000   Pets: Dogs 2   Living Situation: Lives with husband and son.   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  She quit in 1997.     Alcohol Use:  Rarely   Drug Use:  None   Diet:  Regular   Exercise:  Limited; She was exercising up until December but has gotten off track. She was walking/running and was going to the [facility].     Hobbies:  Family               PHYSICAL EXAM  Vitals:   08/28/17 1326  BP: 130/81  Pulse: 77  Weight: 239 lb 12.8 oz (108.8 kg)  Height: _0  (1.676 m)   Body mass index is 38.7 kg/m.  Generalized: Well developed, obese female in no acute distress  Head: normocephalic and atraumatic,. Oropharynx benign  Neck: Supple, no carotid bruits  Cardiac: Regular rate rhythm, no murmur  Musculoskeletal: No deformity   Neurological examination   Mentation: Alert oriented to time, place, history taking. Attention span and concentration appropriate. Recent and remote memory intact.  Follows all commands speech and language fluent.  Crying off and on during the exam  Cranial nerve II-XII: .Pupils were equal round reactive to light extraocular movements were full, visual field were full on confrontational test. Facial sensation and strength were normal. hearing was intact to finger rubbing bilaterally. Uvula tongue midline. head turning and shoulder shrug were normal and symmetric.Tongue protrusion into cheek strength was normal. Motor: normal bulk and tone, full strength in the BUE, BLE, fine finger movements normal, no pronator drift. No focal weakness Sensory: normal and symmetric to light touch, pinprick, and  Vibration, in the upper and lower extremities Coordination: finger-nose-finger, heel-to-shin bilaterally, no dysmetria Reflexes: Symmetric upper and lower plantar responses were flexor bilaterally. Gait and Station: Rising up from  seated position without assistance, normal stance,  moderate stride, good arm swing, smooth turning, able to perform tiptoe, and heel walking without difficulty. Tandem gait is steady.  No assistive device  DIAGNOSTIC DATA (LABS, IMAGING, TESTING) - I reviewed patient records,  labs, notes, testing and imaging myself where available.  Lab Results  Component Value Date   WBC 8.1 06/27/2017   HGB 13.8 06/27/2017   HCT 40.7 06/27/2017   MCV 93.2 06/27/2017   PLT 258.0 06/27/2017      Component Value Date/Time   NA 135 06/27/2017 0916   K 3.9 06/27/2017 0916   CL 102 06/27/2017 0916   CO2 26 06/27/2017 0916   GLUCOSE 103 (H) 06/27/2017 0916   BUN 15 06/27/2017 0916   CREATININE 0.74 06/27/2017 0916   CALCIUM 9.3 06/27/2017 0916   PROT 7.4 06/27/2017 0916   ALBUMIN 4.2 06/27/2017 0916   AST 20 06/27/2017 0916   ALT 38 (H) 06/27/2017 0916   ALKPHOS 75 06/27/2017 0916   BILITOT 0.5 06/27/2017 0916   GFRNONAA >60 01/11/2017 0719   GFRAA >60 01/11/2017 0719   Lab Results  Component Value Date   CHOL 148 01/10/2017   HDL 52 01/10/2017   LDLCALC 77 01/10/2017   TRIG 94 01/10/2017   CHOLHDL 2.8 01/10/2017   Lab Results  Component Value Date   HGBA1C 4.8 01/10/2017   Lab Results  Component Value Date   VITAMINB12 243 06/05/2017   Lab Results  Component Value Date   TSH 2.88 06/27/2017      ASSESSMENT AND PLAN 42 year old Caucasian lady with 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  Vascular risk factors of borderline hyperlipidemia and mild obesity only.    PLAN:Stressed the importance of management of risk factors to prevent further stroke Continue aspirin for secondary stroke prevention Maintain strict control of hypertension with blood pressure goal below 130/90, today's reading 130/81 continue antihypertensive medications Control of diabetes with hemoglobin A1c below 6.5 followed by primary care  Cholesterol with LDL cholesterol less than  70, followed by primary care,  continue statin drugs, Lipitor Exercise by walking, slowly increase , exercise will also help your depression eat healthy diet with whole grains,  fresh fruits and vegetables Follow-up with primary care for stroke risk factor modification, maintain blood pressure goal less than 694 systolic, diabetes with K9O below 7, lipids with LDL below 70 Continue seeing counselor Change positions slowly, stay well-hydrated Follow-up in 6 months She is in the [facility] stroke registry.   I spent 25 minutes in total face to face time with the patient more than 50% of which was spent counseling and coordination of care, reviewing test results reviewing medications and discussing and reviewing the diagnosis of stroke and management of risk factors and further treatment options. , Rayburn Ma, [facility], APRN  [facility] [address] [place], [place] [address] [phone]

## 2017-08-28 ENCOUNTER — Ambulatory Visit: Payer: BC Managed Care – PPO | Admitting: Nurse Practitioner

## 2017-08-28 ENCOUNTER — Encounter: Payer: Self-pay | Admitting: Nurse Practitioner

## 2017-08-28 VITALS — BP 130/81 | HR 77 | Ht 66.0 in | Wt 239.8 lb

## 2017-08-28 DIAGNOSIS — E669 Obesity, unspecified: Secondary | ICD-10-CM | POA: Diagnosis not present

## 2017-08-28 DIAGNOSIS — Z6838 Body mass index (BMI) 38.0-38.9, adult: Secondary | ICD-10-CM

## 2017-08-28 DIAGNOSIS — F418 Other specified anxiety disorders: Secondary | ICD-10-CM

## 2017-08-28 DIAGNOSIS — E785 Hyperlipidemia, unspecified: Secondary | ICD-10-CM | POA: Diagnosis not present

## 2017-08-28 DIAGNOSIS — I639 Cerebral infarction, unspecified: Secondary | ICD-10-CM | POA: Diagnosis not present

## 2017-08-28 NOTE — Progress Notes (Signed)
I agree with the above plan 

## 2017-08-28 NOTE — Patient Instructions (Signed)
Stressed the importance of management of risk factors to prevent further stroke Continue aspirin for secondary stroke prevention Maintain strict control of hypertension with blood pressure goal below 130/90, today's reading 130/81 continue antihypertensive medications Control of diabetes with hemoglobin A1c below 6.5 followed by primary care  Cholesterol with LDL cholesterol less than 70, followed by primary care,  continue statin drugs, Lipitor Exercise by walking, slowly increase ,  eat healthy diet with whole grains,  fresh fruits and vegetables Follow-up with primary care for stroke risk factor modification, maintain blood pressure goal less than 130 systolic, diabetes with A1c below 7, lipids with LDL below 70 Continue seeing counselor Follow-up in 6 months

## 2017-09-27 ENCOUNTER — Ambulatory Visit (INDEPENDENT_AMBULATORY_CARE_PROVIDER_SITE_OTHER): Payer: BC Managed Care – PPO | Admitting: *Deleted

## 2017-09-27 DIAGNOSIS — E538 Deficiency of other specified B group vitamins: Secondary | ICD-10-CM

## 2017-09-27 MED ORDER — CYANOCOBALAMIN 1000 MCG/ML IJ SOLN
1000.0000 ug | Freq: Once | INTRAMUSCULAR | Status: AC
  Administered 2017-09-27: 1000 ug via INTRAMUSCULAR

## 2017-09-27 NOTE — Progress Notes (Signed)
Pt here for monthly B12 injection per Sandford CrazeMelissa O'Sullivan, NP.  B12 1000mcg given IM, left deltoid and pt tolerated injection well.  Next B12 injection scheduled for 10/27/17 at 3:45pm.  Note routed to Dr Patsy Lageropland in PCP's absence.

## 2017-10-10 ENCOUNTER — Other Ambulatory Visit: Payer: Self-pay | Admitting: Family

## 2017-10-11 ENCOUNTER — Ambulatory Visit: Payer: BC Managed Care – PPO | Admitting: Family

## 2017-10-27 ENCOUNTER — Ambulatory Visit (INDEPENDENT_AMBULATORY_CARE_PROVIDER_SITE_OTHER): Payer: BC Managed Care – PPO

## 2017-10-27 DIAGNOSIS — E538 Deficiency of other specified B group vitamins: Secondary | ICD-10-CM | POA: Diagnosis not present

## 2017-10-27 MED ORDER — CYANOCOBALAMIN 1000 MCG/ML IJ SOLN
1000.0000 ug | Freq: Once | INTRAMUSCULAR | Status: AC
  Administered 2017-10-27: 1000 ug via INTRAMUSCULAR

## 2017-10-27 NOTE — Progress Notes (Signed)
Pt here for monthly B12 injection per Sandford CrazeMelissa O'Sullivan   B12 1000mcg given IM left arm, and pt tolerated injection well.  Next B12 injection scheduled for 11/28/17

## 2017-11-09 ENCOUNTER — Other Ambulatory Visit: Payer: Self-pay | Admitting: Family

## 2017-11-09 NOTE — Telephone Encounter (Signed)
2 week supply of atorvastatin sent to pharmacy. Pt cancelled 3 month f/u in June and has not been rescheduled. She was notified in June that OV would be needed before further refills could be given. Mychart message has been sent to pt again to schedule OV.

## 2017-11-28 ENCOUNTER — Ambulatory Visit: Payer: BC Managed Care – PPO

## 2017-11-29 ENCOUNTER — Ambulatory Visit (INDEPENDENT_AMBULATORY_CARE_PROVIDER_SITE_OTHER): Payer: BC Managed Care – PPO

## 2017-11-29 DIAGNOSIS — E538 Deficiency of other specified B group vitamins: Secondary | ICD-10-CM

## 2017-11-29 MED ORDER — CYANOCOBALAMIN 1000 MCG/ML IJ SOLN
1000.0000 ug | Freq: Once | INTRAMUSCULAR | Status: AC
  Administered 2017-11-29: 1000 ug via INTRAMUSCULAR

## 2017-11-29 NOTE — Progress Notes (Signed)
Pre visit review using our clinic tool,if applicable. No additional management support is needed unless otherwise documented below in the visit note.   Pt here for monthly B12 injection per Sandford CrazeMelissa O'Sullivan NP.   B12 1000mcg given IM right deltoid, and pt tolerated injection well.  No complaints voiced this visit.  Next B12 injection scheduled for December 29, 2017. Patient aware.

## 2017-11-29 NOTE — Progress Notes (Signed)
Reviewed. ° ° °Tashiana Lamarca S O'Sullivan NP °

## 2017-12-29 ENCOUNTER — Ambulatory Visit (INDEPENDENT_AMBULATORY_CARE_PROVIDER_SITE_OTHER): Payer: BC Managed Care – PPO

## 2017-12-29 DIAGNOSIS — E538 Deficiency of other specified B group vitamins: Secondary | ICD-10-CM

## 2017-12-29 MED ORDER — CYANOCOBALAMIN 1000 MCG/ML IJ SOLN
1000.0000 ug | Freq: Once | INTRAMUSCULAR | Status: AC
  Administered 2017-12-29: 1000 ug via INTRAMUSCULAR

## 2017-12-29 NOTE — Progress Notes (Signed)
Pre visit review using our clinic tool,if applicable. No additional management support is needed unless otherwise documented below in the visit note.   Pt here for monthly B12 injection per order from M. Peggyann Juba NP.  B12 given IM left deltoid, and pt tolerated injection well. No complaints voiced this visit.  Next B12 injection scheduled for October 9. 2019.

## 2018-01-12 ENCOUNTER — Other Ambulatory Visit: Payer: Self-pay | Admitting: Family

## 2018-01-18 ENCOUNTER — Other Ambulatory Visit: Payer: Self-pay | Admitting: Family

## 2018-01-22 ENCOUNTER — Ambulatory Visit: Payer: BC Managed Care – PPO | Admitting: Cardiology

## 2018-01-22 ENCOUNTER — Encounter: Payer: Self-pay | Admitting: Cardiology

## 2018-01-22 VITALS — BP 122/62 | HR 77 | Ht 66.0 in | Wt 256.0 lb

## 2018-01-22 DIAGNOSIS — E663 Overweight: Secondary | ICD-10-CM | POA: Diagnosis not present

## 2018-01-22 DIAGNOSIS — I639 Cerebral infarction, unspecified: Secondary | ICD-10-CM

## 2018-01-22 DIAGNOSIS — R55 Syncope and collapse: Secondary | ICD-10-CM | POA: Insufficient documentation

## 2018-01-22 DIAGNOSIS — E782 Mixed hyperlipidemia: Secondary | ICD-10-CM

## 2018-01-22 DIAGNOSIS — Z8673 Personal history of transient ischemic attack (TIA), and cerebral infarction without residual deficits: Secondary | ICD-10-CM | POA: Insufficient documentation

## 2018-01-22 HISTORY — DX: Syncope and collapse: R55

## 2018-01-22 NOTE — Patient Instructions (Signed)
Medication Instructions:  Your physician recommends that you continue on your current medications as directed. Please refer to the Current Medication list given to you today.    Labwork: None  Testing/Procedures: None  Follow-Up: Your physician recommends that you schedule a follow-up appointment in: 9 months   Any Other Special Instructions Will Be Listed Below (If Applicable).     If you need a refill on your cardiac medications before your next appointment, please call your pharmacy.   

## 2018-01-22 NOTE — Progress Notes (Signed)
Cardiology Office Note:    Date:  01/22/2018   ID:  Virginia Grant, DOB 03/16/1976, MRN [id number]  PCP:  Sandford Craze, NP  Cardiologist:  Garwin Brothers, MD   Referring MD: Sandford Craze, NP    ASSESSMENT:    1. Mixed hyperlipidemia   2. Cerebrovascular accident (CVA), unspecified mechanism (HCC)   3. Overweight    PLAN:    In order of problems listed above:  1. Primary prevention stressed with the patient.  The results of the TEE and monitoring were discussed with her. 2. Lipids are followed by her primary care physician. 3. Diet was discussed for obesity and risks explained and she vocalized understanding and promises to lose weight. 4. Patient will be seen in follow-up appointment in 9 months or earlier if the patient has any concerns    Medication Adjustments/Labs and Tests Ordered: Current medicines are reviewed at length with the patient today.  Concerns regarding medicines are outlined above.  No orders of the defined types were placed in this encounter.  No orders of the defined types were placed in this encounter.    No chief complaint on file.    History of Present Illness:    Virginia Grant is a 42 y.o. female.  Patient has history of stroke.  She has history of depression.  She denies any problems at this time and takes care of activities of daily living.  She is seen psychiatrist for treatment for depression and getting better.  She tells me that her son went to [place] around that time she had a stroke and she feels that this has made her legs with her diet and she has gained weight.  No chest pain orthopnea or PND.  Past Medical History:  Diagnosis Date  . Anxiety   . Depression   . Headache   . Hypertension   . Stroke [facility]) 12/2016    Past Surgical History:  Procedure Laterality Date  . ESSURE TUBAL LIGATION    . NO PAST SURGERIES    . TEE WITHOUT CARDIOVERSION N/A 01/26/2017   Procedure: TRANSESOPHAGEAL ECHOCARDIOGRAM (TEE);   Surgeon: Lewayne Bunting, MD;  Location[facility] ENDOSCOPY;  Service: Cardiovascular;  Laterality: N/A;    Current Medications: No outpatient medications have been marked as taking for the 01/22/18 encounter (Office Visit) with Revankar, Aundra Dubin, MD.     Allergies:   Patient has no known allergies.   Social History   Socioeconomic History  . Marital status: Married    Spouse name: [name]   . Number of children: 1  . Years of education: 68  . Highest education level: Not on file  Occupational History    Employer: [employer]  Social Needs  . Financial resource strain: Not on file  . Food insecurity:    Worry: Not on file    Inability: Not on file  . Transportation needs:    Medical: Not on file    Non-medical: Not on file  Tobacco Use  . Smoking status: Former Smoker    Packs/day: 1.00    Years: 5.00    Pack years: 5.00    Types: Cigarettes    Last attempt to quit: 04/26/1995    Years since quitting: 22.7  . Smokeless tobacco: Never Used  Substance and Sexual Activity  . Alcohol use: Yes    Alcohol/week: 3.0 standard drinks    Types: 1 Glasses of wine, 1 Cans of beer, 1 Shots of liquor per week  Comment: Rarely  . Drug use: No  . Sexual activity: Yes    Partners: Male  Lifestyle  . Physical activity:    Days per week: 1 day    Minutes per session: 60 min  . Stress: Rather much  Relationships  . Social connections:    Talks on phone: More than three times a week    Gets together: Once a week    Attends religious service: Never    Active member of club or organization: No    Attends meetings of clubs or organizations: Never    Relationship status: Married  Other Topics Concern  . Not on file  Social History Narrative   Marital Status:  Married [name])    Children:  Son [name]) born 2000   Pets: Dogs 2   Living Situation: Lives with husband and son.   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  She quit in 1997.     Alcohol Use:  Rarely   Drug Use:  None   Diet:  Regular   Exercise:  Limited; She was exercising up until December but has gotten off track. She was walking/running and was going to the [facility].     Hobbies:  Family               Family History: The patient's family history includes COPD in her maternal grandmother; Cancer in her maternal aunt and maternal uncle; Diabetes in her father; Heart attack in her maternal uncle; Heart disease in her maternal grandmother; Hypertension in her brother, father, and mother; Stroke in her maternal aunt and paternal grandfather.  ROS:   Please see the history of present illness.    All other systems reviewed and are negative.  EKGs/Labs/Other Studies Reviewed:    The following studies were reviewed today: I discussed my findings with the patient at length.   Recent Labs: 06/27/2017: ALT 38; BUN 15; Creatinine, Ser 0.74; Hemoglobin 13.8; Platelets 258.0; Potassium 3.9; Sodium 135; TSH 2.88  Recent Lipid Panel    Component Value Date/Time   CHOL 148 01/10/2017 0258   TRIG 94 01/10/2017 0258   HDL 52 01/10/2017 0258   CHOLHDL 2.8 01/10/2017 0258   VLDL 19 01/10/2017 0258   LDLCALC 77 01/10/2017 0258    Physical Exam:    VS:  BP 122/62 (BP Location: Right Arm, Patient Position: Sitting, Cuff Size: Normal)   Pulse 77   Ht 5\' 6"  (1.676 m)   Wt 256 lb (116.1 kg)   SpO2 98%   BMI 41.32 kg/m     Wt Readings from Last 3 Encounters:  01/22/18 256 lb (116.1 kg)  08/28/17 239 lb 12.8 oz (108.8 kg)  07/11/17 234 lb 6.4 oz (106.3 kg)     GEN: Patient is in no acute distress HEENT: Normal NECK: No JVD; No carotid bruits LYMPHATICS: No lymphadenopathy CARDIAC: Hear sounds regular, 2/6 systolic murmur at the apex. RESPIRATORY:  Clear to auscultation without rales, wheezing or rhonchi  ABDOMEN: Soft, non-tender, non-distended MUSCULOSKELETAL:  No edema; No deformity  SKIN: Warm and  dry NEUROLOGIC:  Alert and oriented x 3 PSYCHIATRIC:  Normal affect   Signed, Garwin Brothers, MD  01/22/2018 3:32 PM    [facility]

## 2018-01-31 ENCOUNTER — Ambulatory Visit (INDEPENDENT_AMBULATORY_CARE_PROVIDER_SITE_OTHER): Payer: BC Managed Care – PPO

## 2018-01-31 DIAGNOSIS — E538 Deficiency of other specified B group vitamins: Secondary | ICD-10-CM | POA: Diagnosis not present

## 2018-01-31 MED ORDER — CYANOCOBALAMIN 1000 MCG/ML IJ SOLN
1000.0000 ug | Freq: Once | INTRAMUSCULAR | Status: AC
  Administered 2018-01-31: 1000 ug via INTRAMUSCULAR

## 2018-01-31 NOTE — Progress Notes (Signed)
Pre visit review using our clinic tool,if applicable. No additional management support is needed unless otherwise documented below in the visit note.   Pt here for monthly B12 injection per order from [facility]  B12 given IM right deltoid,  pt tolerated injection well. No complaints voiced.  Next B12 injection scheduled for March 02, 2018 @4 :00 pm

## 2018-02-28 ENCOUNTER — Ambulatory Visit: Payer: BC Managed Care – PPO | Admitting: Nurse Practitioner

## 2018-03-02 ENCOUNTER — Ambulatory Visit (INDEPENDENT_AMBULATORY_CARE_PROVIDER_SITE_OTHER): Payer: BC Managed Care – PPO

## 2018-03-02 DIAGNOSIS — E538 Deficiency of other specified B group vitamins: Secondary | ICD-10-CM

## 2018-03-02 MED ORDER — CYANOCOBALAMIN 1000 MCG/ML IJ SOLN
1000.0000 ug | Freq: Once | INTRAMUSCULAR | Status: AC
  Administered 2018-03-02: 1000 ug via INTRAMUSCULAR

## 2018-03-02 NOTE — Progress Notes (Addendum)
Pre visit review using our clinic tool,if applicable. No additional management support is needed unless otherwise documented below in the visit note.   Pt here for monthly B12 injection per order from Sandford Craze NP.  B12 given IM Left deltoid, and pt tolerated injection well.  Next B12 injection scheduled for March 30 2018. Willow Ora, MD

## 2018-03-30 ENCOUNTER — Ambulatory Visit (INDEPENDENT_AMBULATORY_CARE_PROVIDER_SITE_OTHER): Payer: BC Managed Care – PPO

## 2018-03-30 DIAGNOSIS — E538 Deficiency of other specified B group vitamins: Secondary | ICD-10-CM | POA: Diagnosis not present

## 2018-03-30 MED ORDER — CYANOCOBALAMIN 1000 MCG/ML IJ SOLN
1000.0000 ug | Freq: Once | INTRAMUSCULAR | Status: AC
  Administered 2018-03-30: 1000 ug via INTRAMUSCULAR

## 2018-04-24 LAB — HM PAP SMEAR: HM Pap smear: ABNORMAL

## 2018-05-02 ENCOUNTER — Ambulatory Visit (INDEPENDENT_AMBULATORY_CARE_PROVIDER_SITE_OTHER): Payer: BC Managed Care – PPO

## 2018-05-02 DIAGNOSIS — E538 Deficiency of other specified B group vitamins: Secondary | ICD-10-CM | POA: Diagnosis not present

## 2018-05-02 MED ORDER — CYANOCOBALAMIN 1000 MCG/ML IJ SOLN
1000.0000 ug | Freq: Once | INTRAMUSCULAR | Status: AC
  Administered 2018-05-02: 1000 ug via INTRAMUSCULAR

## 2018-05-02 NOTE — Progress Notes (Signed)
Pre visit review using our clinic tool,if applicable. No additional management support is needed unless otherwise documented below in the visit note.   Pt here for monthly B12 injection per M. Peggyann Juba'Sullivan, NP  B12 1000mcg given IM right deltoid, and pt tolerated injection well.  Next B12 injection scheduled for June 01 2018 @ 3:30 pm

## 2018-06-01 ENCOUNTER — Ambulatory Visit (INDEPENDENT_AMBULATORY_CARE_PROVIDER_SITE_OTHER): Payer: BC Managed Care – PPO

## 2018-06-01 DIAGNOSIS — E538 Deficiency of other specified B group vitamins: Secondary | ICD-10-CM

## 2018-06-01 MED ORDER — CYANOCOBALAMIN 1000 MCG/ML IJ SOLN
1000.0000 ug | Freq: Once | INTRAMUSCULAR | Status: AC
  Administered 2018-06-01: 1000 ug via INTRAMUSCULAR

## 2018-06-01 NOTE — Progress Notes (Signed)
Pt here for monthly B12 injection per Melissa Osullivan  B12 1000mcg given IM in left deltoid, and pt tolerated injection well.  Next B12 injection scheduled for next month.   

## 2018-06-01 NOTE — Progress Notes (Signed)
Note reviewed and agree.  Katrina Brosh S O'Sullivan NP 

## 2018-06-29 ENCOUNTER — Ambulatory Visit (INDEPENDENT_AMBULATORY_CARE_PROVIDER_SITE_OTHER): Payer: BC Managed Care – PPO | Admitting: *Deleted

## 2018-06-29 DIAGNOSIS — E538 Deficiency of other specified B group vitamins: Secondary | ICD-10-CM

## 2018-06-29 MED ORDER — CYANOCOBALAMIN 1000 MCG/ML IJ SOLN
1000.0000 ug | Freq: Once | INTRAMUSCULAR | Status: AC
  Administered 2018-06-29: 1000 ug via INTRAMUSCULAR

## 2018-06-29 NOTE — Progress Notes (Addendum)
Pt here for monthly B12 injection per Macario Carls  B12 given IM in right deltoid, and pt tolerated injection well.  Next B12 injection scheduled for next month.   Ok with b12 injection for dx low b12/deficiency.  Esperanza Richters, PA-C

## 2018-07-11 ENCOUNTER — Other Ambulatory Visit: Payer: Self-pay | Admitting: Family

## 2018-07-31 ENCOUNTER — Ambulatory Visit: Payer: BC Managed Care – PPO

## 2020-11-02 ENCOUNTER — Telehealth: Payer: Self-pay | Admitting: Family

## 2020-11-02 NOTE — Telephone Encounter (Signed)
Patient would like to re'eastablish with Melissa. Patient last seen in 2019. Please advise

## 2020-11-24 ENCOUNTER — Ambulatory Visit: Payer: BC Managed Care – PPO | Admitting: Family

## 2020-11-24 ENCOUNTER — Encounter: Payer: Self-pay | Admitting: Family

## 2020-11-24 ENCOUNTER — Other Ambulatory Visit: Payer: Self-pay

## 2020-11-24 ENCOUNTER — Telehealth: Payer: Self-pay | Admitting: Family

## 2020-11-24 VITALS — BP 110/62 | HR 92 | Resp 17 | Ht 66.0 in | Wt 232.0 lb

## 2020-11-24 DIAGNOSIS — E669 Obesity, unspecified: Secondary | ICD-10-CM | POA: Diagnosis not present

## 2020-11-24 DIAGNOSIS — R945 Abnormal results of liver function studies: Secondary | ICD-10-CM

## 2020-11-24 DIAGNOSIS — Z8673 Personal history of transient ischemic attack (TIA), and cerebral infarction without residual deficits: Secondary | ICD-10-CM | POA: Diagnosis not present

## 2020-11-24 DIAGNOSIS — Z0000 Encounter for general adult medical examination without abnormal findings: Secondary | ICD-10-CM | POA: Diagnosis not present

## 2020-11-24 DIAGNOSIS — F418 Other specified anxiety disorders: Secondary | ICD-10-CM

## 2020-11-24 DIAGNOSIS — F411 Generalized anxiety disorder: Secondary | ICD-10-CM

## 2020-11-24 DIAGNOSIS — E785 Hyperlipidemia, unspecified: Secondary | ICD-10-CM | POA: Diagnosis not present

## 2020-11-24 DIAGNOSIS — R7989 Other specified abnormal findings of blood chemistry: Secondary | ICD-10-CM

## 2020-11-24 DIAGNOSIS — Z6837 Body mass index (BMI) 37.0-37.9, adult: Secondary | ICD-10-CM

## 2020-11-24 DIAGNOSIS — Z1159 Encounter for screening for other viral diseases: Secondary | ICD-10-CM

## 2020-11-24 LAB — LIPID PANEL
Cholesterol: 122 mg/dL (ref 0–200)
HDL: 53.3 mg/dL (ref >39.00)
LDL Cholesterol: 57 mg/dL (ref 0–99)
NonHDL: 69.01
Total CHOL/HDL Ratio: 2
Triglycerides: 59 mg/dL (ref 0.0–149.0)
VLDL: 11.8 mg/dL (ref 0.0–40.0)

## 2020-11-24 LAB — COMPREHENSIVE METABOLIC PANEL
ALT: 56 U/L — ABNORMAL HIGH (ref 0–35)
AST: 19 U/L (ref 0–37)
Albumin: 4.1 g/dL (ref 3.5–5.2)
Alkaline Phosphatase: 72 U/L (ref 39–117)
BUN: 17 mg/dL (ref 6–23)
CO2: 27 mEq/L (ref 19–32)
Calcium: 9.4 mg/dL (ref 8.4–10.5)
Chloride: 104 mEq/L (ref 96–112)
Creatinine, Ser: 0.62 mg/dL (ref 0.40–1.20)
GFR: 106.1 mL/min (ref >60.00)
Glucose, Bld: 98 mg/dL (ref 70–99)
Potassium: 4.2 mEq/L (ref 3.5–5.1)
Sodium: 140 mEq/L (ref 135–145)
Total Bilirubin: 0.5 mg/dL (ref 0.2–1.2)
Total Protein: 6.6 g/dL (ref 6.0–8.3)

## 2020-11-24 MED ORDER — ATORVASTATIN CALCIUM 10 MG PO TABS: 10.0000 mg | ORAL_TABLET | Freq: Every day | ORAL | 0 refills | Status: DC

## 2020-11-24 NOTE — Assessment & Plan Note (Signed)
Encouraged pt to continue healthy diet, exercise and weight loss efforts. She will call GYN to schedule pap smear. Order placed for mammo and colonoscopy. Recommended that she obtain covid booster.

## 2020-11-24 NOTE — Assessment & Plan Note (Deleted)
Stable, management per psychiatry.  

## 2020-11-24 NOTE — Assessment & Plan Note (Signed)
Restart atorvastatin 10mg  once daily for secondary stroke prevention.  Check lipid panel.

## 2020-11-24 NOTE — Telephone Encounter (Signed)
Records release request faxed 

## 2020-11-24 NOTE — Assessment & Plan Note (Signed)
Stable, management per psychiatry.  (Followed at [facility] in [place]).

## 2020-11-24 NOTE — Assessment & Plan Note (Signed)
Continue aspirin 325mg  for secondary stroke prevention.

## 2020-11-24 NOTE — Assessment & Plan Note (Signed)
She has lost 28 pounds. Continue healthy diet, regular exercise and weight loss efforts.

## 2020-11-24 NOTE — Progress Notes (Signed)
Subjective:     Patient ID: Virginia Grant, female    DOB: [birth year], 45 y.o.   MRN: [id number]  Chief Complaint  Patient presents with   Re-Establish Care    Blood work     HPI Patient is in today to re-establish care.    Hx of CVA- continues aspirin 329m daily.  Lab Results  Component Value Date   CHOL 148 01/10/2017   HDL 52 01/10/2017   LDLCALC 77 01/10/2017   TRIG 94 01/10/2017   CHOLHDL 2.8 01/10/2017   [facility] [place] Current meds include sertraline 1073m buspar 1538mnd abilify 2mg71mReports symptoms of anxiety and depression stable.    She has lost 28 pounds.  She is working with a traiClinical research associate walking.  Diet has improved.  Wt Readings from Last 3 Encounters:  11/24/20 232 lb (105.2 kg)  01/22/18 256 lb (116.1 kg)  08/28/17 239 lb 12.8 oz (108.8 kg)   Immunizations: tdap 2015, pfizer x 2  Diet: low carb Exercise: walking Colonoscopy:due Pap Smear: due Mammogram: due Vision: 2021 Dental:  up to date    Health Maintenance Due  Topic Date Due   Hepatitis C Screening  Never done   PAP SMEAR-Modifier  03/14/2016   MAMMOGRAM  02/14/2018   COLONOSCOPY (Pts 45-97yr47yrurance coverage will need to be confirmed)  Never done   COVID-19 Vaccine (3 - Booster for [place]) 12/24/2019   INFLUENZA VACCINE  11/23/2020    Past Medical History:  Diagnosis Date   Anxiety    Depression    Headache    Hypertension    Obesity 07/14/2013   Stroke (HCC) [place]2018   Syncope 01/22/2018    Past Surgical History:  Procedure Laterality Date   ESSURE TUBAL LIGATION     NO PAST SURGERIES     TEE WITHOUT CARDIOVERSION N/A 01/26/2017   Procedure: TRANSESOPHAGEAL ECHOCARDIOGRAM (TEE);  Surgeon: CrensLelon Perla  [facility];  Service: Cardiovascular;  Laterality: N/A;    Family History  Problem Relation Age of Onset   Hypertension Mother    Diabetes Father    Hypertension Father    Cancer Maternal Aunt         Colon   Cancer Maternal Uncle        Colon    Heart attack Maternal Uncle    COPD Maternal Grandmother    Heart disease Maternal Grandmother    Stroke Paternal Grandfather    Hypertension Brother    Stroke Maternal Aunt     Social History   Socioeconomic History   Marital status: Married    Spouse name: ChrisGerald Stabsumber of children: 1   Years of education: 16   Highest education level: Not on file  Occupational History    Employer: [employer]  Tobacco Use   Smoking status: Former    Packs/day: 1.00    Years: 5.00    Pack years: 5.00    Types: Cigarettes    Quit date: 04/26/1995    Years since quitting: 25.6   Smokeless tobacco: Never  Vaping Use   Vaping Use: Never used  Substance and Sexual Activity   Alcohol use: Yes    Alcohol/week: 3.0 standard drinks    Types: 1 Glasses of wine, 1 Cans of beer, 1 Shots of liquor per week    Comment: Rarely   Drug use: No   Sexual activity: Yes    Partners:  Male  Other Topics Concern   Not on file  Social History Narrative   Marital Status:  Married Gerald Stabs)    Children:  Son Olen Cordial) born 2000   Pets: Dogs 2   Living Situation: Lives with husband and son.   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  She quit in 1997.     Alcohol Use:  Rarely   Drug Use:  None   Diet:  Regular   Exercise:  Limited; She was exercising up until December but has gotten off track. She was walking/running and was going to the [facility].     Hobbies:  Family             Social Determinants of Health   Financial Resource Strain: Not on file  Food Insecurity: Not on file  Transportation Needs: Not on file  Physical Activity: Not on file  Stress: Not on file  Social Connections: Not on file  Intimate Partner Violence: Not on file    Outpatient Medications Prior to Visit  Medication Sig Dispense Refill   ARIPiprazole (ABILIFY) 2 MG  tablet Take 2 mg by mouth daily.     busPIRone (BUSPAR) 15 MG tablet Take 15 mg by mouth 2 (two) times daily.     cholecalciferol (VITAMIN D) 1000 units tablet Take 3 tablets (3,000 Units total) by mouth daily.     CVS ASPIRIN EC 325 MG EC tablet TAKE 1 TABLET BY MOUTH EVERY DAY 90 tablet 1   ibuprofen (ADVIL,MOTRIN) 200 MG tablet Take 600 mg by mouth every 6 (six) hours as needed for headache (pain).     sertraline (ZOLOFT) 100 MG tablet Take 100 mg by mouth daily.     atorvastatin (LIPITOR) 10 MG tablet TAKE 1 TABLET (10 MG TOTAL) BY MOUTH DAILY AT 6 PM. 14 tablet 0   LORazepam (ATIVAN) 0.5 MG tablet TAKE 1/4 TABLET DAILY AS NEEDED  0   venlafaxine XR (EFFEXOR-XR) 150 MG 24 hr capsule Take 300 mg by mouth daily with breakfast.     No facility-administered medications prior to visit.    No Known Allergies  Review of Systems  Constitutional:  Negative for fever and weight loss.  HENT:  Negative for congestion and hearing loss.   Eyes:  Negative for blurred vision.  Respiratory:  Negative for cough and shortness of breath.   Cardiovascular:  Negative for chest pain.  Gastrointestinal:  Negative for constipation and diarrhea.  Genitourinary:  Negative for dysuria and frequency.  Musculoskeletal:  Negative for joint pain and myalgias.  Skin:  Negative for rash.  Neurological:  Negative for headaches.  Psychiatric/Behavioral:         See HPI      Objective:    Physical Exam  BP 110/62 (BP Location: Left Arm, Patient Position: Sitting, Cuff Size: Normal)   Pulse 92   Resp 17   Ht '5\' 6"'  (1.676 m)   Wt 232 lb (105.2 kg)   SpO2 97%   BMI 37.45 kg/m  Wt Readings from Last 3 Encounters:  11/24/20 232 lb (105.2 kg)  01/22/18 256 lb (116.1 kg)  08/28/17 239 lb 12.8 oz (108.8 kg)   Physical Exam  Constitutional: She is oriented to person, place, and time. She appears well-developed and well-nourished. No distress.  HENT:  Head: Normocephalic and atraumatic.  Right Ear: Tympanic  membrane and ear  canal normal.  Left Ear: Tympanic membrane and ear canal normal.  Mouth/Throat: Oropharynx is clear and moist.  Eyes: Pupils are equal, round, and reactive to light. No scleral icterus.  Neck: Normal range of motion. No thyromegaly present.  Cardiovascular: Normal rate and regular rhythm.   No murmur heard. Pulmonary/Chest: Effort normal and breath sounds normal. No respiratory distress. He has no wheezes. She has no rales. She exhibits no tenderness.  Abdominal: Soft. Bowel sounds are normal. She exhibits no distension and no mass. There is no tenderness. There is no rebound and no guarding.  Musculoskeletal: She exhibits no edema.  Lymphadenopathy:    She has no cervical adenopathy.  Neurological: She is alert and oriented to person, place, and time. She has normal patellar reflexes. She exhibits normal muscle tone. Coordination normal.  Skin: Skin is warm and dry.  Psychiatric: She has a normal mood and affect. Her behavior is normal. Judgment and thought content normal.  Breast/pelvic: deferred to GYN          Assessment & Plan:       Assessment & Plan:   Problem List Items Addressed This Visit       Unprioritized   Routine general medical examination at a health care facility    Encouraged pt to continue healthy diet, exercise and weight loss efforts. She will call GYN to schedule pap smear. Order placed for mammo and colonoscopy. Recommended that she obtain covid booster.        Obesity    She has lost 28 pounds. Continue healthy diet, regular exercise and weight loss efforts.        Hyperlipidemia    Restart atorvastatin 67m once daily for secondary stroke prevention.  Check lipid panel.        Relevant Medications   atorvastatin (LIPITOR) 10 MG tablet   Other Relevant Orders   Lipid panel   History of stroke    Continue aspirin 3252mfor secondary stroke prevention.        Depression with anxiety    Stable, management per psychiatry.   ([facility] [place]         Relevant Medications   busPIRone (BUSPAR) 15 MG tablet   sertraline (ZOLOFT) 100 MG tablet   Other Visit Diagnoses     Preventative health care    -  Primary   Relevant Orders   Ambulatory referral to Gastroenterology   MM 3D SCREEN BREAST BILATERAL   Need for hepatitis C screening test       Relevant Orders   Hepatitis C Antibody   Abnormal LFTs       Relevant Orders   Comp Met (CMET)   Anxiety state       Relevant Medications   busPIRone (BUSPAR) 15 MG tablet   sertraline (ZOLOFT) 100 MG tablet       I have discontinued SaZenia ResidesClement's venlafaxine XR, LORazepam, and atorvastatin. I am also having her start on atorvastatin. Additionally, I am having her maintain her ibuprofen, cholecalciferol, CVS Aspirin EC, ARIPiprazole, busPIRone, and sertraline.  Meds ordered this encounter  Medications   atorvastatin (LIPITOR) 10 MG tablet    Sig: Take 1 tablet (10 mg total) by mouth daily.    Dispense:  90 tablet    Refill:  0    Order Specific Question:   Supervising Provider    Answer:   BLPenni Homans [[id number]]

## 2020-11-24 NOTE — Telephone Encounter (Signed)
Please call [facility] to request pap.

## 2020-11-24 NOTE — Patient Instructions (Signed)
Please restart lipitor $RemoveBeforeD'10mg'BvARvgkHckVUoi$  once daily. Keep up the great work with healthy diet and regular exercise.   Preventive Care 26-47 Years Old, Female Preventive care refers to lifestyle choices and visits with your health care provider that can promote health and wellness. This includes: A yearly physical exam. This is also called an annual wellness visit. Regular dental and eye exams. Immunizations. Screening for certain conditions. Healthy lifestyle choices, such as: Eating a healthy diet. Getting regular exercise. Not using drugs or products that contain nicotine and tobacco. Limiting alcohol use. What can I expect for my preventive care visit? Physical exam Your health care provider will check your: Height and weight. These may be used to calculate your BMI (body mass index). BMI is a measurement that tells if you are at a healthy weight. Heart rate and blood pressure. Body temperature. Skin for abnormal spots. Counseling Your health care provider may ask you questions about your: Past medical problems. Family's medical history. Alcohol, tobacco, and drug use. Emotional well-being. Home life and relationship well-being. Sexual activity. Diet, exercise, and sleep habits. Work and work Statistician. Access to firearms. Method of birth control. Menstrual cycle. Pregnancy history. What immunizations do I need?  Vaccines are usually given at various ages, according to a schedule. Your health care provider will recommend vaccines for you based on your age, medicalhistory, and lifestyle or other factors, such as travel or where you work. What tests do I need? Blood tests Lipid and cholesterol levels. These may be checked every 5 years, or more often if you are over 3 years old. Hepatitis C test. Hepatitis B test. Screening Lung cancer screening. You may have this screening every year starting at age 39 if you have a 30-pack-year history of smoking and currently smoke or have quit  within the past 15 years. Colorectal cancer screening. All adults should have this screening starting at age 5 and continuing until age 83. Your health care provider may recommend screening at age 92 if you are at increased risk. You will have tests every 1-10 years, depending on your results and the type of screening test. Diabetes screening. This is done by checking your blood sugar (glucose) after you have not eaten for a while (fasting). You may have this done every 1-3 years. Mammogram. This may be done every 1-2 years. Talk with your health care provider about when you should start having regular mammograms. This may depend on whether you have a family history of breast cancer. BRCA-related cancer screening. This may be done if you have a family history of breast, ovarian, tubal, or peritoneal cancers. Pelvic exam and Pap test. This may be done every 3 years starting at age 24. Starting at age 74, this may be done every 5 years if you have a Pap test in combination with an HPV test. Other tests STD (sexually transmitted disease) testing, if you are at risk. Bone density scan. This is done to screen for osteoporosis. You may have this scan if you are at high risk for osteoporosis. Talk with your health care provider about your test results, treatment options,and if necessary, the need for more tests. Follow these instructions at home: Eating and drinking  Eat a diet that includes fresh fruits and vegetables, whole grains, lean protein, and low-fat dairy products. Take vitamin and mineral supplements as recommended by your health care provider. Do not drink alcohol if: Your health care provider tells you not to drink. You are pregnant, may be pregnant, or are planning  to become pregnant. If you drink alcohol: Limit how much you have to 0-1 drink a day. Be aware of how much alcohol is in your drink. In the [place] one drink equals one 12 oz bottle of beer (355 mL), one 5 oz glass of  wine (148 mL), or one 1 oz glass of hard liquor (44 mL).  Lifestyle Take daily care of your teeth and gums. Brush your teeth every morning and night with fluoride toothpaste. Floss one time each day. Stay active. Exercise for at least 30 minutes 5 or more days each week. Do not use any products that contain nicotine or tobacco, such as cigarettes, e-cigarettes, and chewing tobacco. If you need help quitting, ask your health care provider. Do not use drugs. If you are sexually active, practice safe sex. Use a condom or other form of protection to prevent STIs (sexually transmitted infections). If you do not wish to become pregnant, use a form of birth control. If you plan to become pregnant, see your health care provider for a prepregnancy visit. If told by your health care provider, take low-dose aspirin daily starting at age 39. Find healthy ways to cope with stress, such as: Meditation, yoga, or listening to music. Journaling. Talking to a trusted person. Spending time with friends and family. Safety Always wear your seat belt while driving or riding in a vehicle. Do not drive: If you have been drinking alcohol. Do not ride with someone who has been drinking. When you are tired or distracted. While texting. Wear a helmet and other protective equipment during sports activities. If you have firearms in your house, make sure you follow all gun safety procedures. What's next? Visit your health care provider once a year for an annual wellness visit. Ask your health care provider how often you should have your eyes and teeth checked. Stay up to date on all vaccines. This information is not intended to replace advice given to you by your health care provider. Make sure you discuss any questions you have with your healthcare provider. Document Revised: 01/14/2020 Document Reviewed: 12/21/2017 Elsevier Patient Education  2022 [employer].

## 2020-11-25 ENCOUNTER — Telehealth: Payer: Self-pay | Admitting: Family

## 2020-11-25 ENCOUNTER — Other Ambulatory Visit: Payer: Self-pay

## 2020-11-25 DIAGNOSIS — R7989 Other specified abnormal findings of blood chemistry: Secondary | ICD-10-CM

## 2020-11-25 DIAGNOSIS — R945 Abnormal results of liver function studies: Secondary | ICD-10-CM

## 2020-11-25 LAB — HEPATITIS C ANTIBODY
Hepatitis C Ab: NONREACTIVE
SIGNAL TO CUT-OFF: 0.01 (ref <1.00)

## 2020-11-25 NOTE — Telephone Encounter (Signed)
Please advise pt that her liver test remains mildly elevated.  I would like for her to complete an abdominal US to evaluate for fatty liver.  Also, I would like for her to return to lab for hepatitis screening.

## 2020-11-25 NOTE — Telephone Encounter (Signed)
Patient advised of results and provider's advise to get [place] and further labs. Patient verbalized understanding and was schedule for future labs, order entered

## 2020-11-30 ENCOUNTER — Other Ambulatory Visit (INDEPENDENT_AMBULATORY_CARE_PROVIDER_SITE_OTHER): Payer: BC Managed Care – PPO

## 2020-11-30 ENCOUNTER — Other Ambulatory Visit: Payer: Self-pay

## 2020-11-30 ENCOUNTER — Other Ambulatory Visit: Payer: BC Managed Care – PPO

## 2020-11-30 DIAGNOSIS — R7989 Other specified abnormal findings of blood chemistry: Secondary | ICD-10-CM

## 2020-11-30 DIAGNOSIS — R945 Abnormal results of liver function studies: Secondary | ICD-10-CM | POA: Diagnosis not present

## 2020-12-01 LAB — HEPATITIS PANEL(REFL)
HEPATITIS C ANTIBODY REFILL$(REFL): NONREACTIVE
Hep B Core Total Ab: NONREACTIVE
Hep B S Ab: NONREACTIVE
Hepatitis A AB,Total: NONREACTIVE
Hepatitis B Surface Ag: NONREACTIVE
SIGNAL TO CUT-OFF: 0.01 (ref <1.00)

## 2020-12-01 LAB — REFLEX TIQ

## 2020-12-02 ENCOUNTER — Ambulatory Visit (HOSPITAL_BASED_OUTPATIENT_CLINIC_OR_DEPARTMENT_OTHER)
Admission: RE | Admit: 2020-12-02 | Discharge: 2020-12-02 | Disposition: A | Discharge status: Home / Self Care | Payer: BC Managed Care – PPO | Source: Ambulatory Visit | Attending: Family | Admitting: Family

## 2020-12-02 ENCOUNTER — Other Ambulatory Visit: Payer: Self-pay

## 2020-12-02 ENCOUNTER — Other Ambulatory Visit (HOSPITAL_BASED_OUTPATIENT_CLINIC_OR_DEPARTMENT_OTHER): Payer: BC Managed Care – PPO

## 2020-12-02 DIAGNOSIS — R7989 Other specified abnormal findings of blood chemistry: Secondary | ICD-10-CM | POA: Diagnosis not present

## 2020-12-25 ENCOUNTER — Ambulatory Visit: Payer: Self-pay | Admitting: Medical

## 2021-01-04 ENCOUNTER — Other Ambulatory Visit: Payer: Self-pay

## 2021-01-04 ENCOUNTER — Ambulatory Visit (HOSPITAL_BASED_OUTPATIENT_CLINIC_OR_DEPARTMENT_OTHER)
Admission: RE | Admit: 2021-01-04 | Discharge: 2021-01-04 | Disposition: A | Discharge status: Home / Self Care | Payer: BC Managed Care – PPO | Source: Ambulatory Visit | Attending: Family | Admitting: Family

## 2021-01-04 ENCOUNTER — Encounter (HOSPITAL_BASED_OUTPATIENT_CLINIC_OR_DEPARTMENT_OTHER): Payer: Self-pay

## 2021-01-04 DIAGNOSIS — Z1231 Encounter for screening mammogram for malignant neoplasm of breast: Secondary | ICD-10-CM | POA: Insufficient documentation

## 2021-01-04 DIAGNOSIS — Z0000 Encounter for general adult medical examination without abnormal findings: Secondary | ICD-10-CM | POA: Insufficient documentation

## 2021-02-21 ENCOUNTER — Other Ambulatory Visit: Payer: Self-pay | Admitting: Family

## 2021-02-21 DIAGNOSIS — E785 Hyperlipidemia, unspecified: Secondary | ICD-10-CM

## 2021-08-30 ENCOUNTER — Other Ambulatory Visit: Payer: Self-pay | Admitting: Family

## 2021-08-30 DIAGNOSIS — E785 Hyperlipidemia, unspecified: Secondary | ICD-10-CM

## 2021-11-23 ENCOUNTER — Ambulatory Visit: Payer: BC Managed Care – PPO | Admitting: Family

## 2021-11-23 VITALS — BP 118/56 | HR 66 | Temp 98.1°F | Resp 16 | Wt 232.0 lb

## 2021-11-23 DIAGNOSIS — R3915 Urgency of urination: Secondary | ICD-10-CM | POA: Insufficient documentation

## 2021-11-23 LAB — POC URINALSYSI DIPSTICK (AUTOMATED)
Bilirubin, UA: NEGATIVE
Blood, UA: NEGATIVE
Glucose, UA: NEGATIVE
Ketones, UA: NEGATIVE
Leukocytes, UA: NEGATIVE
Nitrite, UA: NEGATIVE
Protein, UA: NEGATIVE
Spec Grav, UA: 1.01 (ref 1.010–1.025)
Urobilinogen, UA: 0.2 E.U./dL
pH, UA: 6 (ref 5.0–8.0)

## 2021-11-23 NOTE — Progress Notes (Signed)
Subjective:   By signing my name below, I, Cassell Peppard, attest that this documentation has been prepared under the direction and in the presence of Birdie Sons, NP 11/23/2021   Patient ID: Virginia Grant, female    DOB: 10-20-1975, 46 y.o.   MRN: [id number]  Chief Complaint  Patient presents with   Urinary Urgency    Patient complains of urinary urgency and frequency since yesterday.    HPI Patient is in today for an office visit  UTI: She complains of a possible UTI symptoms. She states that she was experiencing urinary urgency and cold chills while urinating on 11/22/2021. She was taking AZO to help alleviate symptoms.  Pap Smear: She reports that she has an appointment on 12/08/2021 for a pap smear.   Health Maintenance Due  Topic Date Due   COLONOSCOPY (Pts 45-54yrs Insurance coverage will need to be confirmed)  Never done   COVID-19 Vaccine (3 - Pfizer series) 09/18/2019   PAP SMEAR-Modifier  04/24/2021   INFLUENZA VACCINE  11/23/2021    Past Medical History:  Diagnosis Date   Anxiety    Depression    Headache    Hypertension    Obesity 07/14/2013   Stroke (HCC) 12/2016   Syncope 01/22/2018    Past Surgical History:  Procedure Laterality Date   ESSURE TUBAL LIGATION     NO PAST SURGERIES     TEE WITHOUT CARDIOVERSION N/A 01/26/2017   Procedure: TRANSESOPHAGEAL ECHOCARDIOGRAM (TEE);  Surgeon: Lewayne Bunting, MD;  [facility];  Service: Cardiovascular;  Laterality: N/A;    Family History  Problem Relation Age of Onset   Hypertension Mother    Diabetes Father    Hypertension Father    Cancer Maternal Aunt        Colon   Cancer Maternal Uncle        Colon    Heart attack Maternal Uncle    COPD Maternal Grandmother    Heart disease Maternal Grandmother    Stroke Paternal Grandfather    Hypertension Brother    Stroke Maternal Aunt     Social History   Socioeconomic History   Marital status: Married    Spouse name: [name]     Number of children: 1   Years of education: 16   Highest education level: Not on file  Occupational History    Employer: [employer]  Tobacco Use   Smoking status: Former    Packs/day: 1.00    Years: 5.00    Total pack years: 5.00    Types: Cigarettes    Quit date: 04/26/1995    Years since quitting: 26.5   Smokeless tobacco: Never  Vaping Use   Vaping Use: Never used  Substance and Sexual Activity   Alcohol use: Yes    Alcohol/week: 3.0 standard drinks of alcohol    Types: 1 Glasses of wine, 1 Cans of beer, 1 Shots of liquor per week    Comment: Rarely   Drug use: No   Sexual activity: Yes    Partners: Male  Other Topics Concern   Not on file  Social History Narrative   Marital Status:  Married [name])    Children:  Son [name]) born 2000   Pets: Dogs 2   Living Situation: Lives with husband and son.   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  She quit in 1997.     Alcohol Use:  Rarely   Drug Use:  None   Diet:  Regular   Exercise:  Limited; She was exercising up until December but has gotten off track. She was walking/running and was going to the [facility].     Hobbies:  Family             Social Determinants of Health   Financial Resource Strain: Not on file  Food Insecurity: Not on file  Transportation Needs: Not on file  Physical Activity: Insufficiently Active (02/27/2017)   Exercise Vital Sign    Days of Exercise per Week: 1 day    Minutes of Exercise per Session: 60 min  Stress: Stress Concern Present (02/27/2017)   [employer] of Occupational Health - Occupational Stress Questionnaire    Feeling of Stress : Rather much  Social Connections: Somewhat Isolated (02/27/2017)   Social Connection and Isolation Panel [NHANES]    Frequency of Communication with Friends and Family: More than three times a week    Frequency of Social Gatherings with Friends  and Family: Once a week    Attends Religious Services: Never    Database administrator or Organizations: No    Attends Banker Meetings: Never    Marital Status: Married  Catering manager Violence: Not At Risk (02/27/2017)   Humiliation, Afraid, Rape, and Kick questionnaire    Fear of Current or Ex-Partner: No    Emotionally Abused: No    Physically Abused: No    Sexually Abused: No    Outpatient Medications Prior to Visit  Medication Sig Dispense Refill   ARIPiprazole (ABILIFY) 2 MG tablet Take 2 mg by mouth daily.     atorvastatin (LIPITOR) 10 MG tablet TAKE 1 TABLET BY MOUTH EVERY DAY 90 tablet 1   busPIRone (BUSPAR) 15 MG tablet Take 15 mg by mouth 2 (two) times daily.     cholecalciferol (VITAMIN D) 1000 units tablet Take 3 tablets (3,000 Units total) by mouth daily.     CVS ASPIRIN EC 325 MG EC tablet TAKE 1 TABLET BY MOUTH EVERY DAY 90 tablet 1   ibuprofen (ADVIL,MOTRIN) 200 MG tablet Take 600 mg by mouth every 6 (six) hours as needed for headache (pain).     sertraline (ZOLOFT) 100 MG tablet Take 100 mg by mouth daily.     No facility-administered medications prior to visit.    No Known Allergies  ROS     Objective:    Physical Exam Constitutional:      General: She is not in acute distress.    Appearance: Normal appearance. She is not ill-appearing.  HENT:     Head: Normocephalic and atraumatic.     Right Ear: External ear normal.     Left Ear: External ear normal.  Eyes:     Extraocular Movements: Extraocular movements intact.     Pupils: Pupils are equal, round, and reactive to light.  Cardiovascular:     Rate and Rhythm: Normal rate and regular rhythm.     Heart sounds: Normal heart sounds. No murmur heard.    No gallop.  Pulmonary:     Effort: Pulmonary effort is normal. No respiratory distress.     Breath sounds: Normal breath sounds. No wheezing or rales.  Abdominal:     General: Bowel sounds are normal. There is no distension.      Palpations: Abdomen is soft.     Tenderness: There is no abdominal tenderness.  There is no right CVA tenderness, left CVA tenderness or guarding.  Skin:    General: Skin is warm and dry.  Neurological:     Mental Status: She is alert and oriented to person, place, and time.  Psychiatric:        Mood and Affect: Mood normal.        Behavior: Behavior normal.        Judgment: Judgment normal.     BP (!) 118/56 (BP Location: Right Arm, Patient Position: Sitting, Cuff Size: Large)   Pulse 66   Temp 98.1 F (36.7 C) (Oral)   Resp 16   Wt 232 lb (105.2 kg)   SpO2 98%   BMI 37.45 kg/m  Wt Readings from Last 3 Encounters:  11/23/21 232 lb (105.2 kg)  11/24/20 232 lb (105.2 kg)  01/22/18 256 lb (116.1 kg)       Assessment & Plan:   Problem List Items Addressed This Visit       Unprioritized   Urinary urgency - Primary    Occurred yesterday, currently asymptomatic and urinalysis is WNL. Will send for culture. If + culture plan to treat with antibiotics.       Relevant Orders   POCT Urinalysis Dipstick (Automated) (Completed)    No orders of the defined types were placed in this encounter.   I, Lemont Fillers, NP, personally preformed the services described in this documentation.  All medical record entries made by the scribe were at my direction and in my presence.  I have reviewed the chart and discharge instructions (if applicable) and agree that the record reflects my personal performance and is accurate and complete. 11/23/2021   I,Amber Collins,acting as a scribe for Lemont Fillers, NP.,have documented all relevant documentation on the behalf of Lemont Fillers, NP,as directed by  Lemont Fillers, NP while in the presence of Lemont Fillers, NP.    Lemont Fillers, NP

## 2021-11-23 NOTE — Addendum Note (Signed)
Addended by: Wilford Corner on: 11/23/2021 09:40 AM   Modules accepted: Orders

## 2021-11-23 NOTE — Assessment & Plan Note (Signed)
Occurred yesterday, currently asymptomatic and urinalysis is WNL. Will send for culture. If + culture plan to treat with antibiotics.

## 2021-11-24 ENCOUNTER — Telehealth: Payer: Self-pay | Admitting: Family

## 2021-11-24 LAB — URINE CULTURE
MICRO NUMBER:: 13720392
SPECIMEN QUALITY:: ADEQUATE

## 2021-11-24 NOTE — Telephone Encounter (Signed)
Patient advised we are still waiting on culture results, she will continue to take Azo for her symptoms

## 2021-11-24 NOTE — Telephone Encounter (Signed)
Uti sxs are still persistent and she would like to know what to do, she stated it looks like her labs were negative for uti. Please advise.   [id number] - [place], [place] - [address] AT [address]   [address], [place] [address]  Phone:  [phone]  Fax:  [phone]

## 2021-11-25 ENCOUNTER — Telehealth: Payer: Self-pay | Admitting: Family

## 2021-11-25 MED ORDER — NITROFURANTOIN MONOHYD MACRO 100 MG PO CAPS: 100.0000 mg | ORAL_CAPSULE | Freq: Two times a day (BID) | ORAL | 0 refills | Status: DC

## 2021-11-25 NOTE — Telephone Encounter (Signed)
See mychart.  

## 2021-12-02 ENCOUNTER — Ambulatory Visit (INDEPENDENT_AMBULATORY_CARE_PROVIDER_SITE_OTHER): Payer: BC Managed Care – PPO | Admitting: Family

## 2021-12-02 ENCOUNTER — Telehealth: Payer: Self-pay | Admitting: Family

## 2021-12-02 ENCOUNTER — Encounter: Payer: Self-pay | Admitting: Family

## 2021-12-02 VITALS — BP 112/51 | HR 75 | Temp 98.1°F | Resp 16 | Ht 66.0 in | Wt 228.0 lb

## 2021-12-02 DIAGNOSIS — E782 Mixed hyperlipidemia: Secondary | ICD-10-CM

## 2021-12-02 DIAGNOSIS — E538 Deficiency of other specified B group vitamins: Secondary | ICD-10-CM | POA: Diagnosis not present

## 2021-12-02 DIAGNOSIS — Z1211 Encounter for screening for malignant neoplasm of colon: Secondary | ICD-10-CM

## 2021-12-02 DIAGNOSIS — Z1231 Encounter for screening mammogram for malignant neoplasm of breast: Secondary | ICD-10-CM

## 2021-12-02 DIAGNOSIS — F418 Other specified anxiety disorders: Secondary | ICD-10-CM

## 2021-12-02 DIAGNOSIS — Z0000 Encounter for general adult medical examination without abnormal findings: Secondary | ICD-10-CM | POA: Diagnosis not present

## 2021-12-02 DIAGNOSIS — Z8673 Personal history of transient ischemic attack (TIA), and cerebral infarction without residual deficits: Secondary | ICD-10-CM

## 2021-12-02 DIAGNOSIS — R739 Hyperglycemia, unspecified: Secondary | ICD-10-CM

## 2021-12-02 LAB — COMPREHENSIVE METABOLIC PANEL
ALT: 29 U/L (ref 0–35)
AST: 20 U/L (ref 0–37)
Albumin: 4.3 g/dL (ref 3.5–5.2)
Alkaline Phosphatase: 63 U/L (ref 39–117)
BUN: 12 mg/dL (ref 6–23)
CO2: 26 mEq/L (ref 19–32)
Calcium: 8.8 mg/dL (ref 8.4–10.5)
Chloride: 103 mEq/L (ref 96–112)
Creatinine, Ser: 1.01 mg/dL (ref 0.40–1.20)
GFR: 65.89 mL/min (ref >60.00)
Glucose, Bld: 112 mg/dL — ABNORMAL HIGH (ref 70–99)
Potassium: 4.3 mEq/L (ref 3.5–5.1)
Sodium: 138 mEq/L (ref 135–145)
Total Bilirubin: 0.5 mg/dL (ref 0.2–1.2)
Total Protein: 6.9 g/dL (ref 6.0–8.3)

## 2021-12-02 LAB — LIPID PANEL
Cholesterol: 156 mg/dL (ref 0–200)
HDL: 56.4 mg/dL (ref >39.00)
LDL Cholesterol: 88 mg/dL (ref 0–99)
NonHDL: 99.25
Total CHOL/HDL Ratio: 3
Triglycerides: 54 mg/dL (ref 0.0–149.0)
VLDL: 10.8 mg/dL (ref 0.0–40.0)

## 2021-12-02 LAB — HEMOGLOBIN A1C: Hgb A1c MFr Bld: 5.5 % (ref 4.6–6.5)

## 2021-12-02 LAB — B12 AND FOLATE PANEL
Folate: >24.2 ng/mL (ref >5.9)
Vitamin B-12: 237 pg/mL (ref 211–911)

## 2021-12-02 NOTE — Telephone Encounter (Signed)
Patient advised of results, provider's advised and scheduled to start B12 injections 12-07-21

## 2021-12-02 NOTE — Assessment & Plan Note (Signed)
Continue secondary stroke prevention with statin and full dose aspirin.

## 2021-12-02 NOTE — Assessment & Plan Note (Signed)
Tolerating atorvastatin, obtain updated lipid panel.

## 2021-12-02 NOTE — Telephone Encounter (Signed)
Sugar is mildly elevated but not in the diabetes range. Please continue work with healthy diet, exercise and weight loss.    B12 level is low. I would recommend that she begin b12 shots IM weekly x 4 weeks, then monthly.

## 2021-12-02 NOTE — Assessment & Plan Note (Signed)
Discussed healthy diet and regular exercise. Mammo/colo ordered.  Recommended flu shot and Covid booster this fall. Tetanus up to date. Will complete pap with GYN.

## 2021-12-02 NOTE — Progress Notes (Signed)
Subjective:     Patient ID: Virginia Grant, female    DOB: [birth year], 46 y.o.   MRN: [id number]  Chief Complaint  Patient presents with   Annual Exam         HPI Patient is in today for cpx.  Immunizations: Tetanus up to date Diet: Working on improving her diet Wt Readings from Last 3 Encounters:  12/02/21 228 lb (103.4 kg)  11/23/21 232 lb (105.2 kg)  11/24/20 232 lb (105.2 kg)    Exercise: works out with trainer a couple of times a week Colonoscopy: Dexa: Pap Smear: sees Dance movement psychotherapist OB/GYN, Dr. Sharlyn Bologna  Mammogram: due in September Vision: scheduled Dental: up to date    Health Maintenance Due  Topic Date Due   COLONOSCOPY (Pts 45-26yr Insurance coverage will need to be confirmed)  Never done   COVID-19 Vaccine (3 - Pfizer series) 09/18/2019   PAP SMEAR-Modifier  04/24/2021   INFLUENZA VACCINE  11/23/2021    Past Medical History:  Diagnosis Date   Anxiety    Depression    Headache    Hypertension    Obesity 07/14/2013   [place]   Syncope 01/22/2018    Past Surgical History:  Procedure Laterality Date   ESSURE TUBAL LIGATION     NO PAST SURGERIES     TEE WITHOUT CARDIOVERSION N/A 01/26/2017   Procedure: TRANSESOPHAGEAL ECHOCARDIOGRAM (TEE);  Surgeon: CLelon Perla MD;  [facility];  Service: Cardiovascular;  Laterality: N/A;    Family History  Problem Relation Age of Onset   Hypertension Mother    Diabetes Father    Hypertension Father    Cancer Maternal Aunt        Colon   Cancer Maternal Uncle        Colon    Heart attack Maternal Uncle    COPD Maternal Grandmother    Heart disease Maternal Grandmother    Stroke Paternal Grandfather    Hypertension Brother    Stroke Maternal Aunt     Social History   Socioeconomic History   Marital status: Married    Spouse name: CGerald Stabs   Number of children: 1   Years of education: 16   Highest education level: Not on file  Occupational History    Employer: [employer]  Tobacco Use   Smoking status: Former    Packs/day: 1.00    Years: 5.00    Total pack years: 5.00    Types: Cigarettes    Quit date: 04/26/1995    Years since quitting: 26.6   Smokeless tobacco: Never  Vaping Use   Vaping Use: Never used  Substance and Sexual Activity   Alcohol use: Yes    Alcohol/week: 3.0 standard drinks of alcohol    Types: 1 Glasses of wine, 1 Cans of beer, 1 Shots of liquor per week    Comment: Rarely   Drug use: No   Sexual activity: Yes    Partners: Male    Birth control/protection: Surgical  Other Topics Concern   Not on file  Social History Narrative   Marital Status:  Married (Benson    Children:  Son (Olen Cordial born 2000   Pets: Dogs 2   Living Situation: Lives with husband and son.   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  She quit in 1997.  Alcohol Use:  Rarely   Drug Use:  None   Diet:  Regular   Exercise:  Limited; She was exercising up until December but has gotten off track. She was walking/running and was going to the [facility].     Hobbies:  Family             Social Determinants of Health   Financial Resource Strain: Not on file  Food Insecurity: Not on file  Transportation Needs: Not on file  Physical Activity: Insufficiently Active (02/27/2017)   Exercise Vital Sign    Days of Exercise per Week: 1 day    Minutes of Exercise per Session: 60 min  Stress: Stress Concern Present (02/27/2017)   [place]    Feeling of Stress : Rather much  Social Connections: Somewhat Isolated (02/27/2017)   Social Connection and Isolation Panel [NHANES]    Frequency of Communication with Friends and Family: More than three times a week    Frequency of Social Gatherings with Friends and Family: Once a week    Attends Religious Services: Never    Marine scientist or  Organizations: No    Attends Archivist Meetings: Never    Marital Status: Married  Human resources officer Violence: Not At Risk (02/27/2017)   Humiliation, Afraid, Rape, and Kick questionnaire    Fear of Current or Ex-Partner: No    Emotionally Abused: No    Physically Abused: No    Sexually Abused: No    Outpatient Medications Prior to Visit  Medication Sig Dispense Refill   ARIPiprazole (ABILIFY) 2 MG tablet Take 2 mg by mouth daily.     atorvastatin (LIPITOR) 10 MG tablet TAKE 1 TABLET BY MOUTH EVERY DAY 90 tablet 1   busPIRone (BUSPAR) 15 MG tablet Take 15 mg by mouth 2 (two) times daily.     cholecalciferol (VITAMIN D) 1000 units tablet Take 3 tablets (3,000 Units total) by mouth daily.     [facility] ASPIRIN EC 325 MG EC tablet TAKE 1 TABLET BY MOUTH EVERY DAY 90 tablet 1   ibuprofen (ADVIL,MOTRIN) 200 MG tablet Take 600 mg by mouth every 6 (six) hours as needed for headache (pain).     sertraline (ZOLOFT) 100 MG tablet Take 100 mg by mouth daily.     nitrofurantoin, macrocrystal-monohydrate, (MACROBID) 100 MG capsule Take 1 capsule (100 mg total) by mouth 2 (two) times daily. 10 capsule 0   No facility-administered medications prior to visit.    No Known Allergies  Review of Systems  Constitutional:  Positive for weight loss.  HENT:  Negative for congestion and hearing loss.   Eyes:  Negative for blurred vision.  Respiratory:  Negative for cough.   Gastrointestinal:  Negative for constipation and diarrhea.  Genitourinary:  Negative for dysuria and frequency.  Musculoskeletal:  Negative for joint pain and myalgias.  Skin:  Negative for rash.  Neurological:  Negative for headaches.  Psychiatric/Behavioral:         Denies depression/anxiety       Objective:    Physical Exam  BP (!) 112/51 (BP Location: Right Arm, Patient Position: Sitting, Cuff Size: Large)   Pulse 75   Temp 98.1 F (36.7 C) (Oral)   Resp 16   Ht '5\' 6"'  (1.676 m)   Wt 228 lb (103.4 kg)   SpO2 98%    BMI 36.80 kg/m  Wt Readings from Last 3 Encounters:  12/02/21 228 lb (103.4 kg)  11/23/21 232 lb (105.2 kg)  11/24/20 232 lb (105.2 kg)  Physical Exam  Constitutional: She is oriented to person, place, and time. She appears well-developed and well-nourished. No distress.  HENT:  Head: Normocephalic and atraumatic.  Right Ear: Tympanic membrane and ear canal normal.  Left Ear: Tympanic membrane and ear canal normal.  Mouth/Throat: Oropharynx is clear and moist.  Eyes: Pupils are equal, round, and reactive to light. No scleral icterus.  Neck: Normal range of motion. No thyromegaly present.  Cardiovascular: Normal rate and regular rhythm.   No murmur heard. Pulmonary/Chest: Effort normal and breath sounds normal. No respiratory distress. He has no wheezes. She has no rales. She exhibits no tenderness.  Abdominal: Soft. Bowel sounds are normal. She exhibits no distension and no mass. There is no tenderness. There is no rebound and no guarding.  Musculoskeletal: She exhibits no edema.  Lymphadenopathy:    She has no cervical adenopathy.  Neurological: She is alert and oriented to person, place, and time. She has normal patellar reflexes. She exhibits normal muscle tone. Coordination normal.  Skin: Skin is warm and dry.  Psychiatric: She has a normal mood and affect. Her behavior is normal. Judgment and thought content normal.  Breast/Pelvic: deferred to GYN.            Assessment & Plan:        Assessment & Plan:   Problem List Items Addressed This Visit       Unprioritized   Routine general medical examination at a health care facility - Primary    Discussed healthy diet and regular exercise. Mammo/colo ordered.  Recommended flu shot and Covid booster this fall. Tetanus up to date. Will complete pap with GYN.      Hyperlipidemia    Tolerating atorvastatin, obtain updated lipid panel.       Relevant Orders   Comp Met (CMET)   Lipid panel   Hyperglycemia   Relevant  Orders   Hemoglobin A1c   History of stroke    Continue secondary stroke prevention with statin and full dose aspirin.       Depression with anxiety    Stable, following with [facility].      B12 deficiency    Not currently on supplement. Will obtain follow up b12 level.       Relevant Orders   B12 and Folate Panel   Other Visit Diagnoses     Colon cancer screening       Relevant Orders   Ambulatory referral to Gastroenterology   Encounter for screening mammogram for malignant neoplasm of breast       Relevant Orders   MM 3D SCREEN BREAST BILATERAL       I have discontinued Zenia Resides. Reiley's nitrofurantoin (macrocrystal-monohydrate). I am also having her maintain her ibuprofen, cholecalciferol, CVS Aspirin EC, ARIPiprazole, busPIRone, sertraline, and atorvastatin.  No orders of the defined types were placed in this encounter.

## 2021-12-02 NOTE — Assessment & Plan Note (Signed)
Stable, following with [facility].

## 2021-12-02 NOTE — Assessment & Plan Note (Signed)
Not currently on supplement. Will obtain follow up b12 level.

## 2021-12-02 NOTE — Telephone Encounter (Signed)
Called but no answer, lvm for patient to call back for lab results

## 2021-12-07 ENCOUNTER — Ambulatory Visit (INDEPENDENT_AMBULATORY_CARE_PROVIDER_SITE_OTHER): Payer: BC Managed Care – PPO

## 2021-12-07 DIAGNOSIS — E538 Deficiency of other specified B group vitamins: Secondary | ICD-10-CM | POA: Diagnosis not present

## 2021-12-07 MED ORDER — CYANOCOBALAMIN 1000 MCG/ML IJ SOLN
1000.0000 ug | Freq: Once | INTRAMUSCULAR | Status: AC
  Administered 2021-12-07: 1000 ug via INTRAMUSCULAR

## 2021-12-07 NOTE — Progress Notes (Signed)
Virginia Grant is a 46 y.o. female presents to the office today for first B12:  injection, per physician's orders. Original order: on telephone note from 12/02/21  begin b12 shots IM weekly x 4 weeks, then monthly. Cyanocobalamin (med), 1000 mg/ml (dose),  im (route) was administered Lt deltoid (location) today. Patient tolerated injection.  Patient next injection due: in one week, appt made Yes, she was scheduled for all weekly injections every Wednesday at 3:45 for the next 3 weeks.   Wilford Corner

## 2021-12-08 LAB — HM PAP SMEAR: HM Pap smear: NEGATIVE

## 2021-12-15 ENCOUNTER — Ambulatory Visit (INDEPENDENT_AMBULATORY_CARE_PROVIDER_SITE_OTHER): Payer: BC Managed Care – PPO

## 2021-12-15 DIAGNOSIS — E538 Deficiency of other specified B group vitamins: Secondary | ICD-10-CM | POA: Diagnosis not present

## 2021-12-15 MED ORDER — CYANOCOBALAMIN 1000 MCG/ML IJ SOLN
1000.0000 ug | Freq: Once | INTRAMUSCULAR | Status: AC
  Administered 2021-12-15: 1000 ug via INTRAMUSCULAR

## 2021-12-15 NOTE — Progress Notes (Signed)
Pt here for weekly B12 injection per Sandford Craze, NP  B12 given IM, left deltoid and pt tolerated injection well.  Next B12 injection scheduled for next week

## 2021-12-22 ENCOUNTER — Ambulatory Visit (INDEPENDENT_AMBULATORY_CARE_PROVIDER_SITE_OTHER): Payer: BC Managed Care – PPO

## 2021-12-22 DIAGNOSIS — E538 Deficiency of other specified B group vitamins: Secondary | ICD-10-CM | POA: Diagnosis not present

## 2021-12-22 MED ORDER — CYANOCOBALAMIN 1000 MCG/ML IJ SOLN
1000.0000 ug | Freq: Once | INTRAMUSCULAR | Status: AC
  Administered 2021-12-22: 1000 ug via INTRAMUSCULAR

## 2021-12-22 NOTE — Progress Notes (Signed)
Pt here for 3 of 4 weekly B12 injection per Melissa.  B12 1000mcg given R deltoid IM, and pt tolerated injection well.  Next B12 injection scheduled for 1 week.   

## 2021-12-23 ENCOUNTER — Telehealth: Payer: Self-pay | Admitting: Family

## 2021-12-23 NOTE — Telephone Encounter (Signed)
Please call Dr. Chancy Milroy office to request pap smear.

## 2021-12-23 NOTE — Telephone Encounter (Signed)
Request faxed

## 2021-12-29 ENCOUNTER — Ambulatory Visit (INDEPENDENT_AMBULATORY_CARE_PROVIDER_SITE_OTHER): Payer: BC Managed Care – PPO

## 2021-12-29 DIAGNOSIS — E538 Deficiency of other specified B group vitamins: Secondary | ICD-10-CM | POA: Diagnosis not present

## 2021-12-29 MED ORDER — CYANOCOBALAMIN 1000 MCG/ML IJ SOLN
1000.0000 ug | Freq: Once | INTRAMUSCULAR | Status: AC
  Administered 2021-12-29: 1000 ug via INTRAMUSCULAR

## 2021-12-29 NOTE — Progress Notes (Signed)
Pt here for 3 of 4 weekly B12 injection per Melissa.  B12 given R deltoid IM, and pt tolerated injection well.  Next B12 injection scheduled for 1 week.

## 2022-01-10 ENCOUNTER — Ambulatory Visit (HOSPITAL_BASED_OUTPATIENT_CLINIC_OR_DEPARTMENT_OTHER)
Admission: RE | Admit: 2022-01-10 | Discharge: 2022-01-10 | Disposition: A | Discharge status: Home / Self Care | Payer: BC Managed Care – PPO | Source: Ambulatory Visit | Attending: Family | Admitting: Family

## 2022-01-10 ENCOUNTER — Encounter (HOSPITAL_BASED_OUTPATIENT_CLINIC_OR_DEPARTMENT_OTHER): Payer: Self-pay

## 2022-01-10 DIAGNOSIS — Z1231 Encounter for screening mammogram for malignant neoplasm of breast: Secondary | ICD-10-CM | POA: Insufficient documentation

## 2022-02-02 ENCOUNTER — Ambulatory Visit (INDEPENDENT_AMBULATORY_CARE_PROVIDER_SITE_OTHER): Payer: BC Managed Care – PPO

## 2022-02-02 DIAGNOSIS — E538 Deficiency of other specified B group vitamins: Secondary | ICD-10-CM | POA: Diagnosis not present

## 2022-02-02 MED ORDER — CYANOCOBALAMIN 1000 MCG/ML IJ SOLN
1000.0000 ug | Freq: Once | INTRAMUSCULAR | Status: AC
  Administered 2022-02-02: 1000 ug via INTRAMUSCULAR

## 2022-02-02 NOTE — Progress Notes (Signed)
Pt here for 1st Monthly B12 injection per Melissa.  B12 1046mcg given R deltoid IM, and pt tolerated injection well.  Next B12 injection scheduled

## 2022-03-04 ENCOUNTER — Ambulatory Visit (INDEPENDENT_AMBULATORY_CARE_PROVIDER_SITE_OTHER): Payer: BC Managed Care – PPO

## 2022-03-04 DIAGNOSIS — E538 Deficiency of other specified B group vitamins: Secondary | ICD-10-CM

## 2022-03-04 MED ORDER — CYANOCOBALAMIN 1000 MCG/ML IJ SOLN
1000.0000 ug | Freq: Once | INTRAMUSCULAR | Status: AC
  Administered 2022-03-04: 1000 ug via INTRAMUSCULAR

## 2022-03-04 NOTE — Progress Notes (Signed)
Pt here for 2nd Monthly B12 injection per Melissa.  B12 given R deltoid IM, and pt tolerated injection well.  Next B12 injection scheduled

## 2022-03-15 ENCOUNTER — Other Ambulatory Visit: Payer: Self-pay | Admitting: Family

## 2022-03-15 DIAGNOSIS — E785 Hyperlipidemia, unspecified: Secondary | ICD-10-CM

## 2022-04-01 ENCOUNTER — Ambulatory Visit (INDEPENDENT_AMBULATORY_CARE_PROVIDER_SITE_OTHER): Payer: BC Managed Care – PPO

## 2022-04-01 DIAGNOSIS — E538 Deficiency of other specified B group vitamins: Secondary | ICD-10-CM | POA: Diagnosis not present

## 2022-04-01 MED ORDER — CYANOCOBALAMIN 1000 MCG/ML IJ SOLN: 1000.0000 ug | Freq: Once | INTRAMUSCULAR | Status: DC

## 2022-04-01 MED ORDER — CYANOCOBALAMIN 1000 MCG/ML IJ SOLN
1000.0000 ug | Freq: Once | INTRAMUSCULAR | Status: AC
  Administered 2022-04-01: 1000 ug via INTRAMUSCULAR

## 2022-04-01 NOTE — Progress Notes (Signed)
Pt here for #3 Monthly B12 injection per Melissa.  B12 given R deltoid IM, and pt tolerated injection well.  Next B12 injection scheduled

## 2022-04-29 ENCOUNTER — Ambulatory Visit (INDEPENDENT_AMBULATORY_CARE_PROVIDER_SITE_OTHER): Payer: BC Managed Care – PPO

## 2022-04-29 DIAGNOSIS — E538 Deficiency of other specified B group vitamins: Secondary | ICD-10-CM | POA: Diagnosis not present

## 2022-04-29 MED ORDER — CYANOCOBALAMIN 1000 MCG/ML IJ SOLN
1000.0000 ug | Freq: Once | INTRAMUSCULAR | Status: AC
  Administered 2022-04-29: 1000 ug via INTRAMUSCULAR

## 2022-04-29 NOTE — Progress Notes (Signed)
Virginia Grant is a 47 y.o. female presents to the office today for 4th Monthly B12 injection, per physician's orders. Original order: 11/2021: "B12 level is low. I would recommend that she begin b12 shots 1077mcg IM weekly x 4 weeks, then monthly.  Cyanocobalamin (med), 1000 mg/ml (dose),  im (route) was administered L deltoid (location) today. Patient tolerated injection.  Patient due for follow up labs/provider appt: No.  Patient next injection due: 1 month, appt made Yes  [name], Kristine Garbe L

## 2022-05-31 ENCOUNTER — Ambulatory Visit (INDEPENDENT_AMBULATORY_CARE_PROVIDER_SITE_OTHER): Payer: BC Managed Care – PPO

## 2022-05-31 DIAGNOSIS — E538 Deficiency of other specified B group vitamins: Secondary | ICD-10-CM | POA: Diagnosis not present

## 2022-05-31 MED ORDER — CYANOCOBALAMIN 1000 MCG/ML IJ SOLN
1000.0000 ug | Freq: Once | INTRAMUSCULAR | Status: AC
  Administered 2022-05-31: 1000 ug via INTRAMUSCULAR

## 2022-05-31 NOTE — Progress Notes (Signed)
Pt here for monthly B12 shot per PCP. Shot given in left deltoid. Pt handled well and scheduled for next month.

## 2022-06-29 ENCOUNTER — Ambulatory Visit (INDEPENDENT_AMBULATORY_CARE_PROVIDER_SITE_OTHER): Payer: BC Managed Care – PPO

## 2022-06-29 DIAGNOSIS — E538 Deficiency of other specified B group vitamins: Secondary | ICD-10-CM | POA: Diagnosis not present

## 2022-06-29 MED ORDER — CYANOCOBALAMIN 1000 MCG/ML IJ SOLN
1000.0000 ug | Freq: Once | INTRAMUSCULAR | Status: AC
  Administered 2022-06-29: 1000 ug via INTRAMUSCULAR

## 2022-06-29 NOTE — Progress Notes (Signed)
Pt here for monthly B12 injection per Debbrah Alar, NP  B12 1057mg given IM, and pt tolerated injection well.  Next B12 injection scheduled for next month

## 2022-08-02 ENCOUNTER — Ambulatory Visit (INDEPENDENT_AMBULATORY_CARE_PROVIDER_SITE_OTHER): Payer: BC Managed Care – PPO

## 2022-08-02 DIAGNOSIS — E538 Deficiency of other specified B group vitamins: Secondary | ICD-10-CM | POA: Diagnosis not present

## 2022-08-02 MED ORDER — CYANOCOBALAMIN 1000 MCG/ML IJ SOLN
1000.0000 ug | Freq: Once | INTRAMUSCULAR | Status: AC
  Administered 2022-08-02: 1000 ug via INTRAMUSCULAR

## 2022-08-02 NOTE — Progress Notes (Signed)
Pt here for monthly B12 injection per Melissa   B12 1000mcg given L deltoid IM, and pt tolerated injection well.   Next B12 injection scheduled for 1 month.  

## 2022-09-06 ENCOUNTER — Ambulatory Visit (INDEPENDENT_AMBULATORY_CARE_PROVIDER_SITE_OTHER): Payer: BC Managed Care – PPO

## 2022-09-06 DIAGNOSIS — E538 Deficiency of other specified B group vitamins: Secondary | ICD-10-CM

## 2022-09-06 MED ORDER — CYANOCOBALAMIN 1000 MCG/ML IJ SOLN
1000.0000 ug | Freq: Once | INTRAMUSCULAR | Status: AC
  Administered 2022-09-06: 1000 ug via INTRAMUSCULAR

## 2022-09-06 NOTE — Progress Notes (Signed)
Virginia Grant is a 47 y.o. female presents to the office today for Monthly B12 injection, per physician's orders. Original order: 12/02/2021: "B12 level is low. I would recommend that she begin b12 shots IM weekly x 4 weeks, then monthly.  Cyanocobalamin 1000 mg/ml IM was administered L deltoid today. Patient tolerated injection. Patient due for follow up labs/provider appt: No. Date due: 1 year (around 12/03/2022) for annual physical. 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  Patient next injection due: 1 month, appt made Yes  [name], Melton Alar L

## 2022-10-07 ENCOUNTER — Ambulatory Visit (INDEPENDENT_AMBULATORY_CARE_PROVIDER_SITE_OTHER): Payer: BC Managed Care – PPO

## 2022-10-07 DIAGNOSIS — E538 Deficiency of other specified B group vitamins: Secondary | ICD-10-CM | POA: Diagnosis not present

## 2022-10-07 MED ORDER — CYANOCOBALAMIN 1000 MCG/ML IJ SOLN
1000.0000 ug | Freq: Once | INTRAMUSCULAR | Status: AC
  Administered 2022-10-07: 1000 ug via INTRAMUSCULAR

## 2022-10-07 NOTE — Progress Notes (Signed)
Virginia Grant is a 47 y.o. female presents to the office today for Monthly B12  injection, per physician's orders. Original order: 11/2021: "B12 level is low. I would recommend that she begin b12 shots IM weekly x 4 weeks, then monthly.  Cyanocobalamin 1000 mg/ml IM was administered L deltoid today. Patient tolerated injection. 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  Patient next injection due: 1 month, appt made Yes  [name], [name]   DOD: Drue Novel

## 2022-11-07 NOTE — Progress Notes (Unsigned)
Virginia Grant is a 47 y.o. female presents to the office today for Monthly B12 injection, per physician's orders. Original order: 11/2021: "B12 level is low. I would recommend that she begin b12 shots IM weekly x 4 weeks, then monthly.  Cyanocobalamin 1000 mg/ml IM was administered L deltoid today. Patient tolerated injection. Patient due for follow up labs/provider appt: (around 12/03/2022) for annual physical, appt made No- pt says she will schedule for September. [employer] returns 11/24/22 and she wants to work a little before having her CPE. Patient next injection due: 1 month, appt made Yes.   [name], [name]

## 2022-11-08 ENCOUNTER — Ambulatory Visit (INDEPENDENT_AMBULATORY_CARE_PROVIDER_SITE_OTHER): Payer: BC Managed Care – PPO

## 2022-11-08 DIAGNOSIS — E538 Deficiency of other specified B group vitamins: Secondary | ICD-10-CM

## 2022-11-08 MED ORDER — CYANOCOBALAMIN 1000 MCG/ML IJ SOLN
1000.0000 ug | Freq: Once | INTRAMUSCULAR | Status: AC
  Administered 2022-11-08: 1000 ug via INTRAMUSCULAR

## 2022-11-23 ENCOUNTER — Encounter (INDEPENDENT_AMBULATORY_CARE_PROVIDER_SITE_OTHER): Payer: Self-pay

## 2022-11-26 ENCOUNTER — Other Ambulatory Visit: Payer: Self-pay

## 2022-11-26 ENCOUNTER — Emergency Department (HOSPITAL_BASED_OUTPATIENT_CLINIC_OR_DEPARTMENT_OTHER)
Admission: EM | Admit: 2022-11-26 | Discharge: 2022-11-26 | Disposition: A | Discharge status: Home / Self Care | Payer: BC Managed Care – PPO | Attending: Emergency Medicine | Admitting: Emergency Medicine

## 2022-11-26 ENCOUNTER — Encounter (HOSPITAL_BASED_OUTPATIENT_CLINIC_OR_DEPARTMENT_OTHER): Payer: Self-pay

## 2022-11-26 DIAGNOSIS — Z79899 Other long term (current) drug therapy: Secondary | ICD-10-CM | POA: Insufficient documentation

## 2022-11-26 DIAGNOSIS — L03211 Cellulitis of face: Secondary | ICD-10-CM | POA: Insufficient documentation

## 2022-11-26 DIAGNOSIS — R21 Rash and other nonspecific skin eruption: Secondary | ICD-10-CM

## 2022-11-26 DIAGNOSIS — I10 Essential (primary) hypertension: Secondary | ICD-10-CM | POA: Diagnosis not present

## 2022-11-26 MED ORDER — AMOXICILLIN 500 MG PO CAPS: 500.0000 mg | ORAL_CAPSULE | Freq: Three times a day (TID) | ORAL | 0 refills | Status: AC

## 2022-11-26 MED ORDER — DOXYCYCLINE HYCLATE 100 MG PO CAPS: 100.0000 mg | ORAL_CAPSULE | Freq: Two times a day (BID) | ORAL | 0 refills | Status: AC

## 2022-11-26 NOTE — ED Triage Notes (Signed)
The patient stated she thinks she was bit by a spider above her right eye one week ago. Now its red,swollen and hurting. She denied fever.

## 2022-11-26 NOTE — ED Provider Notes (Signed)
[facility] [place] Provider Note   CSN: [id number] Arrival date & time: 11/26/22  1210     History  Chief Complaint  Patient presents with   Rash    Virginia Grant is a 47 y.o. female.  HPI      47yo female with history of hypertension, CVA, syncope who presents with concern for rash.  1 week ago noted a lesion above her right eye, a few small grouped pimples, less like vesicles.  Then yesterday began to have pain over the area and with shooting pain going towards right temple, right cervical lymph node pain.  No numbness, weakness, change in vision, difficulty talking or walking.  Pain burning.  No fevers, nausea, vomiting.  Not sure if she had bite.     Past Medical History:  Diagnosis Date   Anxiety    Depression    Headache    Hypertension    Obesity 07/14/2013   Stroke ([facility]) 12/2016   Syncope 01/22/2018     Home Medications Prior to Admission medications   Medication Sig Start Date End Date Taking? Authorizing Provider  amoxicillin (AMOXIL) 500 MG capsule Take 1 capsule (500 mg total) by mouth 3 (three) times daily for 7 days. 11/26/22 12/03/22 Yes Alvira Monday, MD  doxycycline (VIBRAMYCIN) 100 MG capsule Take 1 capsule (100 mg total) by mouth 2 (two) times daily for 7 days. 11/26/22 12/03/22 Yes Alvira Monday, MD  ARIPiprazole (ABILIFY) 2 MG tablet Take 2 mg by mouth daily. 09/11/20   [provider]  atorvastatin (LIPITOR) 10 MG tablet Take 1 tablet (10 mg total) by mouth daily. 03/15/22   Sandford Craze, NP  busPIRone (BUSPAR) 15 MG tablet Take 15 mg by mouth 2 (two) times daily. 09/12/20   [provider]  cholecalciferol (VITAMIN D) 1000 units tablet Take 3 tablets (3,000 Units total) by mouth daily. 06/13/17   Sandford Craze, NP  CVS ASPIRIN EC 325 MG EC tablet TAKE 1 TABLET BY MOUTH EVERY DAY 07/11/18   Sandford Craze, NP  ibuprofen (ADVIL,MOTRIN) 200 MG tablet Take 600 mg by mouth every 6  (six) hours as needed for headache (pain).    [provider]  sertraline (ZOLOFT) 100 MG tablet Take 100 mg by mouth daily. 10/07/20   [provider]      Allergies    Patient has no known allergies.    Review of Systems   Review of Systems  Physical Exam Updated Vital Signs BP 106/68   Pulse 66   Temp 97.8 F (36.6 C) (Temporal)   Resp 18   Ht 5\' 6"  (1.676 m)   Wt 104.3 kg   SpO2 96%   BMI 37.12 kg/m  Physical Exam Vitals and nursing note reviewed.  Constitutional:      General: She is not in acute distress.    Appearance: She is well-developed. She is not diaphoretic.  HENT:     Head: Normocephalic and atraumatic.  Eyes:     Conjunctiva/sclera: Conjunctivae normal.  Cardiovascular:     Rate and Rhythm: Normal rate and regular rhythm.  Pulmonary:     Effort: Pulmonary effort is normal. No respiratory distress.  Abdominal:     General: There is no distension.     Palpations: Abdomen is soft.     Tenderness: There is no abdominal tenderness. There is no guarding.  Musculoskeletal:        General: No tenderness.     Cervical back: Normal  range of motion.  Skin:    General: Skin is warm and dry.     Findings: Erythema (2.5cm area of erythema superior to right eye, mild erythema right scalp) present. No rash.  Neurological:     Mental Status: She is alert and oriented to person, place, and time.       ED Results / Procedures / Treatments   Labs (all labs ordered are listed, but only abnormal results are displayed) Labs Reviewed - No data to display  EKG None  Radiology No results found.  Procedures Procedures    Medications Ordered in ED Medications - No data to display  ED Course/ Medical Decision Making/ A&P                                  [phone] female with history of hypertension, CVA, syncope who presents with concern for rash.  Possible spider bite as nidus for infection.  No sign of orbital cellulitis, underlying  abscess, doubt subgaleal or intracranial abscess. Headache began after development of this infectious lesion-no fever, doubt ICH, meningitis.  Do not see other lesions to suggest shingles.  Given rx for doxycycline and amoxicillin for cellulitis.  Discussed reasons to return. Patient discharged in stable condition with understanding of reasons to return.         Final Clinical Impression(s) / ED Diagnoses Final diagnoses:  Rash  Cellulitis of face    Rx / DC Orders ED Discharge Orders          Ordered    doxycycline (VIBRAMYCIN) 100 MG capsule  2 times daily        11/26/22 1446    amoxicillin (AMOXIL) 500 MG capsule  3 times daily        11/26/22 1446              Alvira Monday, MD 11/26/22 2136

## 2022-11-29 ENCOUNTER — Ambulatory Visit: Payer: BC Managed Care – PPO | Admitting: Physician Assistant

## 2022-11-29 ENCOUNTER — Encounter: Payer: Self-pay | Admitting: Physician Assistant

## 2022-11-29 VITALS — BP 116/70 | HR 60 | Temp 98.1°F | Resp 20 | Ht 66.0 in | Wt 215.0 lb

## 2022-11-29 DIAGNOSIS — B029 Zoster without complications: Secondary | ICD-10-CM | POA: Diagnosis not present

## 2022-11-29 MED ORDER — GABAPENTIN 100 MG PO CAPS: 100.0000 mg | ORAL_CAPSULE | Freq: Three times a day (TID) | ORAL | 0 refills | Status: DC

## 2022-11-29 NOTE — Progress Notes (Signed)
Established patient visit   Patient: Virginia Grant   DOB: 07-02-1975   47 y.o. Female  MRN: [id number] Visit Date: 11/29/2022  Today's healthcare provider: Alfredia Ferguson, PA-C   Cc. Rash/bite  Subjective    HPI   Pt was seen in the ED 8/3, she noticed some lesions above her right eye, painful and burning. Thought to be a spider bite. Pt was prescribed doxycycline and amoxicillin to treat resulting cellulitis.   Starting x 3 days ago she has been having abdominal pain, nausea, vomiting. Today feels more improved, last episode of vomiting 3 days ago. Overall feels wiped and fatigued.  Pt denies eye pain, changes in vision to right eye. Medications: Outpatient Medications Prior to Visit  Medication Sig   amoxicillin (AMOXIL) 500 MG capsule Take 1 capsule (500 mg total) by mouth 3 (three) times daily for 7 days.   ARIPiprazole (ABILIFY) 2 MG tablet Take 2 mg by mouth daily.   ARIPiprazole (ABILIFY) 5 MG tablet Take 5 mg by mouth daily.   atorvastatin (LIPITOR) 10 MG tablet Take 1 tablet (10 mg total) by mouth daily.   busPIRone (BUSPAR) 30 MG tablet Take 30 mg by mouth 2 (two) times daily.   cholecalciferol (VITAMIN D) 1000 units tablet Take 3 tablets (3,000 Units total) by mouth daily.   [facility] ASPIRIN EC 325 MG EC tablet TAKE 1 TABLET BY MOUTH EVERY DAY   doxycycline (VIBRAMYCIN) 100 MG capsule Take 1 capsule (100 mg total) by mouth 2 (two) times daily for 7 days.   ibuprofen (ADVIL,MOTRIN) 200 MG tablet Take 600 mg by mouth every 6 (six) hours as needed for headache (pain).   LORazepam (ATIVAN) 0.5 MG tablet TAKE 1-2 TABLETS PER DAY AS NEEDED FOR ANXIETY   propranolol (INDERAL) 20 MG tablet TAKE 1 TABLET BY MOUTH TWICE A DAY AS NEEDED FOR ANXIETY   sertraline (ZOLOFT) 100 MG tablet Take 100 mg by mouth daily.   [DISCONTINUED] busPIRone (BUSPAR) 15 MG tablet Take 15 mg by mouth 2 (two) times daily.   No facility-administered medications prior to visit.   Review of Systems   Constitutional:  Negative for fatigue and fever.  Respiratory:  Negative for cough and shortness of breath.   Cardiovascular:  Negative for chest pain and leg swelling.  Gastrointestinal:  Negative for abdominal pain.  Skin:  Positive for rash.  Neurological:  Negative for dizziness and headaches.      Objective    BP 116/70 (BP Location: Left Arm, Patient Position: Sitting, Cuff Size: Normal)   Pulse 60   Temp 98.1 F (36.7 C)   Resp 20   Ht 5\' 6"  (1.676 m)   Wt 215 lb (97.5 kg)   SpO2 99%   BMI 34.70 kg/m   Physical Exam Vitals reviewed.  Constitutional:      Appearance: She is not ill-appearing.  HENT:     Head: Normocephalic.  Eyes:     Extraocular Movements: Extraocular movements intact.     Conjunctiva/sclera: Conjunctivae normal.     Pupils: Pupils are equal, round, and reactive to light.  Cardiovascular:     Rate and Rhythm: Normal rate.  Pulmonary:     Effort: Pulmonary effort is normal. No respiratory distress.  Skin:    Comments: Vesicular, crusted, erythematous rash across right forehead and on right eyelid. Swelling to right eyelid.  Neurological:     General: No focal deficit present.     Mental Status: She is alert and  oriented to person, place, and time.  Psychiatric:        Mood and Affect: Mood normal.        Behavior: Behavior normal.      No results found for any visits on 11/29/22.  Assessment & Plan     1. Herpes zoster without complication Reviewed case w/ pcp Sandford Craze who also agrees that this is consistent w/ herpes zoster. Given this is 1.5-2 weeks since onset,antivirals may not be helpful . Rx gabapentin for pain relief , advised 100 mg prn tid cautioned dizziness.  Advised ok to stop abx.   - gabapentin (NEURONTIN) 100 MG capsule; Take 1 capsule (100 mg total) by mouth 3 (three) times daily.  Dispense: 30 capsule; Refill: 0   Return if symptoms worsen or fail to improve.      I, Alfredia Ferguson, PA-C have reviewed all  documentation for this visit. The documentation on  11/29/22   for the exam, diagnosis, procedures, and orders are all accurate and complete.    Alfredia Ferguson, PA-C  [facility] [phone] (phone) [phone] (fax)  [facility]

## 2022-12-09 ENCOUNTER — Ambulatory Visit (INDEPENDENT_AMBULATORY_CARE_PROVIDER_SITE_OTHER): Payer: BC Managed Care – PPO

## 2022-12-09 DIAGNOSIS — E538 Deficiency of other specified B group vitamins: Secondary | ICD-10-CM

## 2022-12-09 MED ORDER — CYANOCOBALAMIN 1000 MCG/ML IJ SOLN
1000.0000 ug | Freq: Once | INTRAMUSCULAR | Status: AC
  Administered 2022-12-09: 1000 ug via INTRAMUSCULAR

## 2022-12-09 NOTE — Progress Notes (Signed)
Pt here for monthly B12 injection per Melissa   B12 1000mcg given L deltoid IM, and pt tolerated injection well.   Next B12 injection scheduled for 1 month.  

## 2023-01-13 ENCOUNTER — Ambulatory Visit (INDEPENDENT_AMBULATORY_CARE_PROVIDER_SITE_OTHER): Payer: BC Managed Care – PPO

## 2023-01-13 DIAGNOSIS — E538 Deficiency of other specified B group vitamins: Secondary | ICD-10-CM | POA: Diagnosis not present

## 2023-01-13 MED ORDER — CYANOCOBALAMIN 1000 MCG/ML IJ SOLN
1000.0000 ug | Freq: Once | INTRAMUSCULAR | Status: AC
  Administered 2023-01-13: 1000 ug via INTRAMUSCULAR

## 2023-01-13 NOTE — Progress Notes (Signed)
Virginia Grant is a 47 y.o. female presents to the office today for Monthly B12 injection, per physician's orders. Original order: 11/2021: "B12 level is low. I would recommend that she begin b12 shots IM weekly x 4 weeks, then monthly.  Cyanocobalamin 1000 mg/ml IM was administered L deltoid today. Patient tolerated injection. Patient due for follow up labs/provider appt: yes Patient next injection due: 1 month, appt made Yes.    [name], [name]

## 2023-02-15 ENCOUNTER — Ambulatory Visit (INDEPENDENT_AMBULATORY_CARE_PROVIDER_SITE_OTHER): Payer: BC Managed Care – PPO | Admitting: Family

## 2023-02-15 ENCOUNTER — Telehealth: Payer: Self-pay | Admitting: Family

## 2023-02-15 ENCOUNTER — Encounter: Payer: Self-pay | Admitting: Family

## 2023-02-15 VITALS — BP 109/52 | HR 55 | Temp 97.9°F | Resp 16 | Ht 66.0 in | Wt 218.0 lb

## 2023-02-15 DIAGNOSIS — F418 Other specified anxiety disorders: Secondary | ICD-10-CM

## 2023-02-15 DIAGNOSIS — Z1231 Encounter for screening mammogram for malignant neoplasm of breast: Secondary | ICD-10-CM

## 2023-02-15 DIAGNOSIS — Z1211 Encounter for screening for malignant neoplasm of colon: Secondary | ICD-10-CM

## 2023-02-15 DIAGNOSIS — E538 Deficiency of other specified B group vitamins: Secondary | ICD-10-CM | POA: Diagnosis not present

## 2023-02-15 DIAGNOSIS — Z23 Encounter for immunization: Secondary | ICD-10-CM

## 2023-02-15 DIAGNOSIS — E782 Mixed hyperlipidemia: Secondary | ICD-10-CM | POA: Diagnosis not present

## 2023-02-15 DIAGNOSIS — R739 Hyperglycemia, unspecified: Secondary | ICD-10-CM

## 2023-02-15 DIAGNOSIS — Z0000 Encounter for general adult medical examination without abnormal findings: Secondary | ICD-10-CM | POA: Diagnosis not present

## 2023-02-15 DIAGNOSIS — Z8673 Personal history of transient ischemic attack (TIA), and cerebral infarction without residual deficits: Secondary | ICD-10-CM

## 2023-02-15 LAB — COMPREHENSIVE METABOLIC PANEL
ALT: 23 U/L (ref 0–35)
AST: 15 U/L (ref 0–37)
Albumin: 4.4 g/dL (ref 3.5–5.2)
Alkaline Phosphatase: 62 U/L (ref 39–117)
BUN: 22 mg/dL (ref 6–23)
CO2: 25 meq/L (ref 19–32)
Calcium: 9.3 mg/dL (ref 8.4–10.5)
Chloride: 104 meq/L (ref 96–112)
Creatinine, Ser: 0.86 mg/dL (ref 0.40–1.20)
GFR: 79.24 mL/min (ref >60.00)
Glucose, Bld: 118 mg/dL — ABNORMAL HIGH (ref 70–99)
Potassium: 4.4 meq/L (ref 3.5–5.1)
Sodium: 139 meq/L (ref 135–145)
Total Bilirubin: 0.5 mg/dL (ref 0.2–1.2)
Total Protein: 7.1 g/dL (ref 6.0–8.3)

## 2023-02-15 LAB — LIPID PANEL
Cholesterol: 154 mg/dL (ref 0–200)
HDL: 70.7 mg/dL (ref >39.00)
LDL Cholesterol: 74 mg/dL (ref 0–99)
NonHDL: 83.68
Total CHOL/HDL Ratio: 2
Triglycerides: 48 mg/dL (ref 0.0–149.0)
VLDL: 9.6 mg/dL (ref 0.0–40.0)

## 2023-02-15 LAB — VITAMIN B12: Vitamin B-12: 266 pg/mL (ref 211–911)

## 2023-02-15 LAB — HEMOGLOBIN A1C: Hgb A1c MFr Bld: 5.3 % (ref 4.6–6.5)

## 2023-02-15 MED ORDER — CYANOCOBALAMIN 1000 MCG/ML IJ SOLN
1000.0000 ug | Freq: Once | INTRAMUSCULAR | Status: AC
  Administered 2023-02-15: 1000 ug via INTRAMUSCULAR

## 2023-02-15 NOTE — Assessment & Plan Note (Signed)
Continues b12 injections monthly. Update b12 level.  Injection today.

## 2023-02-15 NOTE — Assessment & Plan Note (Addendum)
Notes that she hasn't taken lipitor recently- forgets. Encouraged her to take nightly for secondary stroke prevention.

## 2023-02-15 NOTE — Progress Notes (Signed)
Subjective:     Patient ID: Virginia Grant, female    DOB: [birth year], 47 y.o.   MRN: [id number]  Chief Complaint  Patient presents with   Annual Exam    HPI  Discussed the use of AI scribe software for clinical note transcription with the patient, who gave verbal consent to proceed.  History of Present Illness         Patient presents today for complete physical.  Immunizations: Td today, declines flu shot.  Encouraged covid booster at pharmacy Diet: healthy Wt Readings from Last 3 Encounters:  02/15/23 218 lb (98.9 kg)  11/29/22 215 lb (97.5 kg)  11/26/22 230 lb (104.3 kg)    Exercise: not exercising regular Colonoscopy: due Pap Smear: has pap next month 2023 had High risk HPV- Sees Dr. Rito Ehrlich Mammogram: due Vision/dental: up to date    Health Maintenance Due  Topic Date Due   Colonoscopy  Never done   COVID-19 Vaccine (3 - 2023-24 season) 12/25/2022   MAMMOGRAM  01/11/2023    Past Medical History:  Diagnosis Date   Anxiety    Depression    Headache    Hypertension    Obesity 07/14/2013   Stroke (HCC) 12/2016   Syncope 01/22/2018    Past Surgical History:  Procedure Laterality Date   ESSURE TUBAL LIGATION     NO PAST SURGERIES     TEE WITHOUT CARDIOVERSION N/A 01/26/2017   Procedure: TRANSESOPHAGEAL ECHOCARDIOGRAM (TEE);  Surgeon: Lewayne Bunting, MD;  Location: [facility] ENDOSCOPY;  Service: Cardiovascular;  Laterality: N/A;    Family History  Problem Relation Age of Onset   Hypertension Mother    Diabetes Father    Hypertension Father    Cancer Maternal Aunt        Colon   Cancer Maternal Uncle        Colon    Heart attack Maternal Uncle    COPD Maternal Grandmother    Heart disease Maternal Grandmother    Stroke Paternal Grandfather    Hypertension Brother    Stroke Maternal Aunt     Social History   Socioeconomic History   Marital status: Married    Spouse name: Virginia Grant    Number of children: 1   Years of education: 16   Highest  education level: Not on file  Occupational History    Employer: [employer]  Tobacco Use   Smoking status: Former    Current packs/day: 0.00    Average packs/day: 1 pack/day for 5.0 years (5.0 ttl pk-yrs)    Types: Cigarettes    Start date: 04/25/1990    Quit date: 04/26/1995    Years since quitting: 27.8   Smokeless tobacco: Never  Vaping Use   Vaping status: Never Used  Substance and Sexual Activity   Alcohol use: Yes    Alcohol/week: 3.0 standard drinks of alcohol    Types: 1 Glasses of wine, 1 Cans of beer, 1 Shots of liquor per week    Comment: Rarely   Drug use: No   Sexual activity: Yes    Partners: Male    Birth control/protection: Surgical  Other Topics Concern   Not on file  Social History Narrative   Marital Status:  Married [place])    Children:  Son Virginia Grant) born 2000   Pets: Dogs 2   Living Situation: Lives with husband and son.   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  She quit in 1997.     Alcohol Use:  Rarely   Drug Use:  None   Diet:  Regular   Exercise:  Limited; She was exercising up until December but has gotten off track. She was walking/running and was going to the [facility].     Hobbies:  Family             Social Determinants of Health   Financial Resource Strain: Not on file  Food Insecurity: Not on file  Transportation Needs: Not on file  Physical Activity: Insufficiently Active (02/27/2017)   Exercise Vital Sign    Days of Exercise per Week: 1 day    Minutes of Exercise per Session: 60 min  Stress: Stress Concern Present (02/27/2017)   [employer] of Occupational Health - Occupational Stress Questionnaire    Feeling of Stress : Rather much  Social Connections: Somewhat Isolated (02/27/2017)   Social Connection and Isolation Panel [NHANES]    Frequency of Communication with Friends and Family: More than three times a week     Frequency of Social Gatherings with Friends and Family: Once a week    Attends Religious Services: Never    Database administrator or Organizations: No    Attends Banker Meetings: Never    Marital Status: Married  Catering manager Violence: Not At Risk (02/27/2017)   Humiliation, Afraid, Rape, and Kick questionnaire    Fear of Current or Ex-Partner: No    Emotionally Abused: No    Physically Abused: No    Sexually Abused: No    Outpatient Medications Prior to Visit  Medication Sig Dispense Refill   ARIPiprazole (ABILIFY) 2 MG tablet Take 2 mg by mouth daily.     ARIPiprazole (ABILIFY) 5 MG tablet Take 5 mg by mouth daily.     atorvastatin (LIPITOR) 10 MG tablet Take 1 tablet (10 mg total) by mouth daily. 90 tablet 3   busPIRone (BUSPAR) 30 MG tablet Take 30 mg by mouth 2 (two) times daily.     cholecalciferol (VITAMIN D) 1000 units tablet Take 3 tablets (3,000 Units total) by mouth daily.     CVS ASPIRIN EC 325 MG EC tablet TAKE 1 TABLET BY MOUTH EVERY DAY 90 tablet 1   gabapentin (NEURONTIN) 100 MG capsule Take 1 capsule (100 mg total) by mouth 3 (three) times daily. 30 capsule 0   ibuprofen (ADVIL,MOTRIN) 200 MG tablet Take 600 mg by mouth every 6 (six) hours as needed for headache (pain).     LORazepam (ATIVAN) 0.5 MG tablet TAKE 1-2 TABLETS PER DAY AS NEEDED FOR ANXIETY     propranolol (INDERAL) 20 MG tablet TAKE 1 TABLET BY MOUTH TWICE A DAY AS NEEDED FOR ANXIETY     sertraline (ZOLOFT) 100 MG tablet Take 100 mg by mouth daily.     No facility-administered medications prior to visit.    No Known Allergies  Review of Systems  Constitutional:  Negative for weight loss.  Cardiovascular:  Negative for leg swelling.  Psychiatric/Behavioral:  Negative for depression. The patient is not nervous/anxious.        Objective:    Physical Exam   BP (!) 109/52 (BP Location: Right Arm, Patient Position: Sitting, Cuff Size: Large)   Pulse (!) 55   Temp 97.9 F (36.6  C) (Oral)   Resp 16   Ht 5\' 6"  (1.676 m)   Wt 218 lb (98.9 kg)  SpO2 99%   BMI 35.19 kg/m  Wt Readings from Last 3 Encounters:  02/15/23 218 lb (98.9 kg)  11/29/22 215 lb (97.5 kg)  11/26/22 230 lb (104.3 kg)  Physical Exam  Constitutional: She is oriented to person, place, and time. She appears well-developed and well-nourished. No distress.  HENT:  Head: Normocephalic and atraumatic.  Right Ear: Tympanic membrane and ear canal normal.  Left Ear: Tympanic membrane and ear canal normal.  Mouth/Throat: Oropharynx is clear and moist.  Eyes: Pupils are equal, round, and reactive to light. No scleral icterus.  Neck: Normal range of motion. No thyromegaly present.  Cardiovascular: Normal rate and regular rhythm.   No murmur heard. Pulmonary/Chest: Effort normal and breath sounds normal. No respiratory distress. He has no wheezes. She has no rales. She exhibits no tenderness.  Abdominal: Soft. Bowel sounds are normal. She exhibits no distension and no mass. There is no tenderness. There is no rebound and no guarding.  Musculoskeletal: She exhibits no edema.  Lymphadenopathy:    She has no cervical adenopathy.  Neurological: She is alert and oriented to person, place, and time. She has normal patellar reflexes. She exhibits normal muscle tone. Coordination normal.  Skin: Skin is warm and dry.  Psychiatric: She has a normal mood and affect. Her behavior is normal. Judgment and thought content normal.  Breasts: Examined lying Right: Without masses, retractions, discharge or axillary adenopathy.  Left: Without masses, retractions, discharge or axillary adenopathy.  Pelvic: deferred to GYN          Assessment & Plan:       Assessment & Plan:   Problem List Items Addressed This Visit       Unprioritized   Routine general medical examination at a health care facility - Primary    Continue healthy diet, exercise and weight loss efforts.  Refer for mammo. Continue follow up with  GYN for paps. Declines flu shot. Refer for colo. Td today, recommended covid booster at the pharmacy      Hyperlipidemia    Notes that she hasn't taken lipitor recently- forgets. Encouraged her to take nightly for secondary stroke prevention.       Relevant Orders   Lipid panel   Hyperglycemia    Lab Results  Component Value Date   HGBA1C 5.5 12/02/2021   Update A1C.       Relevant Orders   Comp Met (CMET)   HgB A1c   History of stroke    Continues aspirin and statin.        Depression with anxiety    [facility] is helping with meds.  Reports stable.       B12 deficiency    Continues b12 injections monthly. Update b12 level.  Injection today.       Relevant Orders   B12   Other Visit Diagnoses     Screening for colon cancer       Relevant Orders   Ambulatory referral to Gastroenterology   Breast cancer screening by mammogram       Relevant Orders   MM 3D SCREENING MAMMOGRAM BILATERAL BREAST       I am having Thornton Papas. Neary maintain her ibuprofen, cholecalciferol, CVS Aspirin EC, ARIPiprazole, sertraline, atorvastatin, LORazepam, propranolol, ARIPiprazole, busPIRone, and gabapentin.  No orders of the defined types were placed in this encounter.

## 2023-02-15 NOTE — Assessment & Plan Note (Signed)
Continues aspirin and statin.  

## 2023-02-15 NOTE — Assessment & Plan Note (Addendum)
[facility] is helping with meds.  Reports stable.

## 2023-02-15 NOTE — Assessment & Plan Note (Addendum)
Lab Results  Component Value Date   HGBA1C 5.5 12/02/2021   Update A1C.

## 2023-02-15 NOTE — Telephone Encounter (Signed)
B12 is still very low.  Let's increase the frequency of her b12 injections to every 2 weeks. Repeat b12 in 3 months.   Sugar is elevated, but not in the diabetes range.  Continue work on [employer], exercise and weight loss.

## 2023-02-15 NOTE — Assessment & Plan Note (Signed)
Continue healthy diet, exercise and weight loss efforts.  Refer for mammo. Continue follow up with GYN for paps. Declines flu shot. Refer for colo. Td today, recommended covid booster at the pharmacy

## 2023-02-16 ENCOUNTER — Encounter: Payer: Self-pay | Admitting: Gastroenterology

## 2023-02-16 NOTE — Telephone Encounter (Signed)
Patient notified of results and provider's recommendations. She was scheduled for B12 in 2 weeks.

## 2023-03-01 ENCOUNTER — Ambulatory Visit: Payer: BC Managed Care – PPO

## 2023-03-01 DIAGNOSIS — E538 Deficiency of other specified B group vitamins: Secondary | ICD-10-CM | POA: Diagnosis not present

## 2023-03-01 MED ORDER — CYANOCOBALAMIN 1000 MCG/ML IJ SOLN
1000.0000 ug | Freq: Once | INTRAMUSCULAR | Status: AC
  Administered 2023-03-01: 1000 ug via INTRAMUSCULAR

## 2023-03-01 NOTE — Progress Notes (Signed)
Pt here for bi-weekly B12 injection per Melissa  B12 given IM, and pt tolerated injection well.  Next B12 injection scheduled for 11/20

## 2023-03-03 ENCOUNTER — Telehealth (HOSPITAL_BASED_OUTPATIENT_CLINIC_OR_DEPARTMENT_OTHER): Payer: Self-pay

## 2023-03-15 ENCOUNTER — Ambulatory Visit (INDEPENDENT_AMBULATORY_CARE_PROVIDER_SITE_OTHER): Payer: BC Managed Care – PPO

## 2023-03-15 ENCOUNTER — Ambulatory Visit (AMBULATORY_SURGERY_CENTER): Payer: BC Managed Care – PPO

## 2023-03-15 ENCOUNTER — Encounter: Payer: Self-pay | Admitting: Gastroenterology

## 2023-03-15 VITALS — Ht 67.0 in | Wt 215.0 lb

## 2023-03-15 DIAGNOSIS — E538 Deficiency of other specified B group vitamins: Secondary | ICD-10-CM

## 2023-03-15 DIAGNOSIS — Z1211 Encounter for screening for malignant neoplasm of colon: Secondary | ICD-10-CM

## 2023-03-15 MED ORDER — CYANOCOBALAMIN 1000 MCG/ML IJ SOLN
1000.0000 ug | Freq: Once | INTRAMUSCULAR | Status: AC
  Administered 2023-03-15: 1000 ug via INTRAMUSCULAR

## 2023-03-15 MED ORDER — NA SULFATE-K SULFATE-MG SULF 17.5-3.13-1.6 GM/177ML PO SOLN: 1.0000 | Freq: Once | ORAL | 0 refills | Status: AC

## 2023-03-15 NOTE — Progress Notes (Signed)
Pt here for monthly B12 injection per Melissa  B12 given IM, and pt tolerated injection well.  Next B12 injection scheduled for 12/4

## 2023-03-15 NOTE — Progress Notes (Signed)
Pre visit completed via phone call; Patient verified name, DOB, and address; No egg or soy allergy known to patient;  No issues known to pt with past sedation with any surgeries or procedures; Patient denies ever being told they had issues or difficulty with intubation; No FH of Malignant Hyperthermia; Pt is not on diet pills; Pt is not on home 02;  Pt is not on blood thinners;  Pt denies issues with constipation;  No A fib or A flutter; Have any cardiac testing pending--NO Insurance verified during PV appt--- BCBS Pt can ambulate without assistance;  Pt denies use of chewing tobacco; Discussed diabetic/weight loss medication holds; Discussed NSAID holds; Checked BMI to be less than 50; Pt instructed to use Singlecare.com or GoodRx for a price reduction on prep;  Patient's chart reviewed by Cathlyn Parsons CNRA prior to previsit and patient appropriate for the [facility];  Pre visit completed and red dot placed by patient's name on their procedure day (on provider's schedule);

## 2023-03-29 ENCOUNTER — Ambulatory Visit (INDEPENDENT_AMBULATORY_CARE_PROVIDER_SITE_OTHER): Payer: BC Managed Care – PPO

## 2023-03-29 DIAGNOSIS — E538 Deficiency of other specified B group vitamins: Secondary | ICD-10-CM

## 2023-03-29 MED ORDER — CYANOCOBALAMIN 1000 MCG/ML IJ SOLN
1000.0000 ug | Freq: Once | INTRAMUSCULAR | Status: AC
  Administered 2023-03-29: 1000 ug via INTRAMUSCULAR

## 2023-03-29 NOTE — Progress Notes (Signed)
 Pt here for monthly B12 injection per Melissa  B12 given IM, and pt tolerated injection well.  Next B12 injection scheduled for 12/18

## 2023-04-03 ENCOUNTER — Encounter: Payer: Self-pay | Admitting: Gastroenterology

## 2023-04-03 ENCOUNTER — Ambulatory Visit (AMBULATORY_SURGERY_CENTER): Payer: BC Managed Care – PPO | Admitting: Gastroenterology

## 2023-04-03 VITALS — BP 132/75 | HR 56 | Temp 98.4°F | Resp 23 | Ht 66.0 in | Wt 215.0 lb

## 2023-04-03 DIAGNOSIS — Z1211 Encounter for screening for malignant neoplasm of colon: Secondary | ICD-10-CM

## 2023-04-03 DIAGNOSIS — K641 Second degree hemorrhoids: Secondary | ICD-10-CM

## 2023-04-03 DIAGNOSIS — K648 Other hemorrhoids: Secondary | ICD-10-CM

## 2023-04-03 DIAGNOSIS — K644 Residual hemorrhoidal skin tags: Secondary | ICD-10-CM

## 2023-04-03 MED ORDER — SODIUM CHLORIDE 0.9 % IV SOLN: 500.0000 mL | Freq: Once | INTRAVENOUS | Status: DC

## 2023-04-03 NOTE — Patient Instructions (Signed)
There were no colon polyps seen today!  You will need another screening colonoscopy in 10 years, you will receive a letter at that time when you are due for the procedure.   Please call us at [phone] if you have a change in bowel habits, change in family history of colo-rectal cancer, rectal bleeding or other GI concern before that time.  Resume previous diet Continue present medications   Handouts/information given for hemorrhoids  YOU HAD AN ENDOSCOPIC PROCEDURE TODAY AT THE [facility]:   Refer to the procedure report that was given to you for any specific questions about what was found during the examination.  If the procedure report does not answer your questions, please call your gastroenterologist to clarify.  If you requested that your care partner not be given the details of your procedure findings, then the procedure report has been included in a sealed envelope for you to review at your convenience later.  YOU SHOULD EXPECT: Some feelings of bloating in the abdomen. Passage of more gas than usual.  Walking can help get rid of the air that was put into your GI tract during the procedure and reduce the bloating. If you had a lower endoscopy (such as a colonoscopy or flexible sigmoidoscopy) you may notice spotting of blood in your stool or on the toilet paper. If you underwent a bowel prep for your procedure, you may not have a normal bowel movement for a few days.  Please Note:  You might notice some irritation and congestion in your nose or some drainage.  This is from the oxygen used during your procedure.  There is no need for concern and it should clear up in a day or so.  SYMPTOMS TO REPORT IMMEDIATELY:  Following lower endoscopy (colonoscopy):  Excessive amounts of blood in the stool  Significant tenderness or worsening of abdominal pains  Swelling of the abdomen that is new, acute  Fever of 100F or higher  For urgent or emergent issues, a gastroenterologist  can be reached at any hour by calling [phone]. Do not use MyChart messaging for urgent concerns.    DIET:  We do recommend a small meal at first, but then you may proceed to your regular diet.  Drink plenty of fluids but you should avoid alcoholic beverages for 24 hours.  ACTIVITY:  You should plan to take it easy for the rest of today and you should NOT DRIVE or use heavy machinery until tomorrow (because of the sedation medicines used during the test).    FOLLOW UP: Our staff will call the number listed on your records the next business day following your procedure.  We will call around 7:15- 8:00 am to check on you and address any questions or concerns that you may have regarding the information given to you following your procedure. If we do not reach you, we will leave a message.      SIGNATURES/CONFIDENTIALITY: You and/or your care partner have signed paperwork which will be entered into your electronic medical record.  These signatures attest to the fact that that the information above on your After Visit Summary has been reviewed and is understood.  Full responsibility of the confidentiality of this discharge information lies with you and/or your care-partner.

## 2023-04-03 NOTE — Progress Notes (Signed)
Pt's states no medical or surgical changes since previsit or office visit. 

## 2023-04-03 NOTE — Op Note (Signed)
[facility] Patient Name: Virginia Grant Procedure Date: 04/03/2023 11:47 AM MRN: [id number] Endoscopist: Doristine Locks , MD, [id number] Age: 47 Referring MD:  Date of Birth: [birth year] Gender: Female Account #: [id number] Procedure:                Colonoscopy Indications:              Screening for colorectal malignant neoplasm, This                            is the patient's first colonoscopy Medicines:                Monitored Anesthesia Care Procedure:                Pre-Anesthesia Assessment:                           - Prior to the procedure, a History and Physical                            was performed, and patient medications and                            allergies were reviewed. The patient's tolerance of                            previous anesthesia was also reviewed. The risks                            and benefits of the procedure and the sedation                            options and risks were discussed with the patient.                            All questions were answered, and informed consent                            was obtained. Prior Anticoagulants: The patient has                            taken no anticoagulant or antiplatelet agents. ASA                            Grade Assessment: II - A patient with mild systemic                            disease. After reviewing the risks and benefits,                            the patient was deemed in satisfactory condition to                            undergo the procedure.  After obtaining informed consent, the colonoscope                            was passed under direct vision. Throughout the                            procedure, the patient's blood pressure, pulse, and                            oxygen saturations were monitored continuously. The                            CF [id number] #[id number] was introduced through the anus                            and advanced to  the the terminal ileum. The                            colonoscopy was performed without difficulty. The                            patient tolerated the procedure well. The quality                            of the bowel preparation was good. The terminal                            ileum, ileocecal valve, appendiceal orifice, and                            rectum were photographed. Scope In: 12:04:19 PM Scope Out: 12:14:55 PM Scope Withdrawal Time: 0 hours 7 minutes 1 second  Total Procedure Duration: 0 hours 10 minutes 36 seconds  Findings:                 Hemorrhoids were found on perianal exam.                           The colon appeared normal.                           Non-bleeding external and internal hemorrhoids were                            found during retroflexion. The hemorrhoids were                            medium-sized.                           The terminal ileum appeared normal. Complications:            No immediate complications. Estimated Blood Loss:     Estimated blood loss: none. Impression:               - Hemorrhoids found on perianal exam.                           -  The entire examined colon is normal.                           - Non-bleeding external and internal hemorrhoids.                           - The examined portion of the ileum was normal.                           - No specimens collected. Recommendation:           - Patient has a contact number available for                            emergencies. The signs and symptoms of potential                            delayed complications were discussed with the                            patient. Return to normal activities tomorrow.                            Written discharge instructions were provided to the                            patient.                           - Resume previous diet.                           - Continue present medications.                           - Repeat colonoscopy  in 10 years for screening                            purposes.                           - Use fiber, for example Citrucel, Fibercon, Konsyl                            or Metamucil.                           - Return to GI clinic PRN. Doristine Locks, MD 04/03/2023 12:18:42 PM

## 2023-04-03 NOTE — Progress Notes (Signed)
GASTROENTEROLOGY PROCEDURE H&P NOTE   Primary Care Physician: Sandford Craze, NP    Reason for Procedure:  Colon Cancer screening  Plan:    Colonoscopy  Patient is appropriate for endoscopic procedure(s) in the ambulatory ([facility]) setting.  The nature of the procedure, as well as the risks, benefits, and alternatives were carefully and thoroughly reviewed with the patient. Ample time for discussion and questions allowed. The patient understood, was satisfied, and agreed to proceed.     HPI: Virginia Grant is a 46 y.o. female who presents for colonoscopy for routine Colon Cancer screening.  No active GI symptoms.  No known family history of colon cancer or related malignancy.  Patient is otherwise without complaints or active issues today.  Past Medical History:  Diagnosis Date   Anxiety    on meds   Depression    on meds   Headache    Hypertension    on meds   Obesity 07/14/2013   Stroke (HCC) 12/2016   Syncope 01/22/2018    Past Surgical History:  Procedure Laterality Date   ESSURE TUBAL LIGATION     did not receive anesthesia   TEE WITHOUT CARDIOVERSION N/A 01/26/2017   Procedure: TRANSESOPHAGEAL ECHOCARDIOGRAM (TEE);  Surgeon: Lewayne Bunting, MD;  Location: [facility] ENDOSCOPY;  Service: Cardiovascular;  Laterality: N/A;    Prior to Admission medications   Medication Sig Start Date End Date Taking? Authorizing Provider  Na Sulfate-K Sulfate-Mg Sulf 17.5-3.13-1.6 GM/177ML SOLN PLEASE SEE ATTACHED FOR DETAILED DIRECTIONS 03/15/23  Yes [provider]  ARIPiprazole (ABILIFY) 2 MG tablet Take 2 mg by mouth daily. 09/11/20   [provider]  ARIPiprazole (ABILIFY) 5 MG tablet Take 5 mg by mouth daily. 09/06/22   [provider]  atorvastatin (LIPITOR) 10 MG tablet Take 1 tablet (10 mg total) by mouth daily. 03/15/22   Sandford Craze, NP  busPIRone (BUSPAR) 30 MG tablet Take 30 mg by mouth 2 (two) times daily. 11/07/22   [provider]  cholecalciferol (VITAMIN D) 1000 units tablet Take 3 tablets (3,000 Units total) by mouth daily. Patient not taking: Reported on 03/15/2023 06/13/17   Sandford Craze, NP  [facility] ASPIRIN EC 325 MG EC tablet TAKE 1 TABLET BY MOUTH EVERY DAY Patient not taking: Reported on 03/15/2023 07/11/18   Sandford Craze, NP  folic acid (FOLVITE) 800 MCG tablet Take 400 mcg by mouth daily.    [provider]  ibuprofen (ADVIL,MOTRIN) 200 MG tablet Take 600 mg by mouth every 6 (six) hours as needed for headache (pain).    [provider]  LORazepam (ATIVAN) 0.5 MG tablet TAKE 1-2 TABLETS PER DAY AS NEEDED FOR ANXIETY 06/24/22   [provider]  propranolol (INDERAL) 20 MG tablet TAKE 1 TABLET BY MOUTH TWICE A DAY AS NEEDED FOR ANXIETY 11/07/22   [provider]  sertraline (ZOLOFT) 100 MG tablet Take 100 mg by mouth daily. 10/07/20   [provider]    Current Outpatient Medications  Medication Sig Dispense Refill   Na Sulfate-K Sulfate-Mg Sulf 17.5-3.13-1.6 GM/177ML SOLN PLEASE SEE ATTACHED FOR DETAILED DIRECTIONS     ARIPiprazole (ABILIFY) 2 MG tablet Take 2 mg by mouth daily.     ARIPiprazole (ABILIFY) 5 MG tablet Take 5 mg by mouth daily.     atorvastatin (LIPITOR) 10 MG tablet Take 1 tablet (10 mg total) by mouth daily. 90 tablet 3   busPIRone (BUSPAR) 30 MG tablet Take 30 mg by mouth 2 (two) times daily.  cholecalciferol (VITAMIN D) 1000 units tablet Take 3 tablets (3,000 Units total) by mouth daily. (Patient not taking: Reported on 03/15/2023)     [facility] ASPIRIN EC 325 MG EC tablet TAKE 1 TABLET BY MOUTH EVERY DAY (Patient not taking: Reported on 03/15/2023) 90 tablet 1   folic acid (FOLVITE) 800 MCG tablet Take 400 mcg by mouth daily.     ibuprofen (ADVIL,MOTRIN) 200 MG tablet Take 600 mg by mouth every 6 (six) hours as needed for headache (pain).     LORazepam (ATIVAN) 0.5 MG tablet TAKE 1-2 TABLETS PER DAY AS NEEDED FOR ANXIETY      propranolol (INDERAL) 20 MG tablet TAKE 1 TABLET BY MOUTH TWICE A DAY AS NEEDED FOR ANXIETY     sertraline (ZOLOFT) 100 MG tablet Take 100 mg by mouth daily.     Current Facility-Administered Medications  Medication Dose Route Frequency Provider Last Rate Last Admin   0.9 %  sodium chloride infusion  500 mL Intravenous Once Yvonna Brun V, DO        Allergies as of 04/03/2023   (No Known Allergies)    Family History  Problem Relation Age of Onset   Colon polyps Mother 95   Hypertension Mother    Diabetes Father    Hypertension Father    Hypertension Brother    Cancer Maternal Aunt        Colon   Stroke Maternal Aunt    Cancer Maternal Uncle        Colon    Heart attack Maternal Uncle    COPD Maternal Grandmother    Heart disease Maternal Grandmother    Stroke Paternal Grandfather    Colon cancer Neg Hx    Esophageal cancer Neg Hx    Rectal cancer Neg Hx    Stomach cancer Neg Hx     Social History   Socioeconomic History   Marital status: Married    Spouse name: [name]    Number of children: 1   Years of education: 16   Highest education level: Not on file  Occupational History    Employer: [employer]  Tobacco Use   Smoking status: Former    Current packs/day: 0.00    Average packs/day: 1 pack/day for 5.0 years (5.0 ttl pk-yrs)    Types: Cigarettes    Start date: 04/25/1990    Quit date: 04/26/1995    Years since quitting: 27.9   Smokeless tobacco: Never  Vaping Use   Vaping status: Never Used  Substance and Sexual Activity   Alcohol use: Not Currently    Alcohol/week: 3.0 standard drinks of alcohol    Types: 3 Standard drinks or equivalent per week    Comment: Rarely   Drug use: No   Sexual activity: Yes    Partners: Male    Birth control/protection: Surgical  Other Topics Concern   Not on file  Social History Narrative   Marital Status:  Married [place])    Children:  Son [name]) born 2000   Pets: Dogs 2   Living Situation: Lives with husband  and son.   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  She quit in 1997.     Alcohol Use:  Rarely   Drug Use:  None   Diet:  Regular   Exercise:  Limited; She was exercising up until December but has gotten  off track. She was walking/running and was going to the [facility].     Hobbies:  Family             Social Determinants of Health   Financial Resource Strain: Not on file  Food Insecurity: Not on file  Transportation Needs: Not on file  Physical Activity: Insufficiently Active (02/27/2017)   Exercise Vital Sign    Days of Exercise per Week: 1 day    Minutes of Exercise per Session: 60 min  Stress: Stress Concern Present (02/27/2017)   [employer] of Occupational Health - Occupational Stress Questionnaire    Feeling of Stress : Rather much  Social Connections: Somewhat Isolated (02/27/2017)   Social Connection and Isolation Panel [NHANES]    Frequency of Communication with Friends and Family: More than three times a week    Frequency of Social Gatherings with Friends and Family: Once a week    Attends Religious Services: Never    Database administrator or Organizations: No    Attends Banker Meetings: Never    Marital Status: Married  Catering manager Violence: Not At Risk (02/27/2017)   Humiliation, Afraid, Rape, and Kick questionnaire    Fear of Current or Ex-Partner: No    Emotionally Abused: No    Physically Abused: No    Sexually Abused: No    Physical Exam: Vital signs in last 24 hours: @BP  124/70   Pulse (!) 52   Temp 98.4 F (36.9 C) (Temporal)   Ht 5\' 6"  (1.676 m)   Wt 215 lb (97.5 kg)   LMP 03/20/2023 (Exact Date) Comment: Essure Tubal ligation  SpO2 99%   BMI 34.70 kg/m  GEN: NAD EYE: Sclerae anicteric ENT: MMM CV: Non-tachycardic Pulm: CTA b/l GI: Soft, NT/ND NEURO:  Alert & Oriented x 3   Doristine Locks, DO [place]  [facility]   04/03/2023 11:26 AM

## 2023-04-03 NOTE — Progress Notes (Signed)
Pt resting comfortably. VSS. Airway intact. SBAR complete to RN. All questions answered.   

## 2023-04-04 ENCOUNTER — Telehealth: Payer: Self-pay

## 2023-04-04 NOTE — Telephone Encounter (Signed)
  Follow up Call-     04/03/2023   11:01 AM  Call back number  Post procedure Call Back phone  # [phone]  Permission to leave phone message Yes     Patient questions:  Do you have a fever, pain , or abdominal swelling? No. Pain Score  0 *  Have you tolerated food without any problems? Yes.    Have you been able to return to your normal activities? Yes.    Do you have any questions about your discharge instructions: Diet   No. Medications  No. Follow up visit  No.  Do you have questions or concerns about your Care? No.  Actions: * If pain score is 4 or above: No action needed, pain <4.

## 2023-04-12 ENCOUNTER — Ambulatory Visit (INDEPENDENT_AMBULATORY_CARE_PROVIDER_SITE_OTHER): Payer: BC Managed Care – PPO

## 2023-04-12 DIAGNOSIS — E538 Deficiency of other specified B group vitamins: Secondary | ICD-10-CM | POA: Diagnosis not present

## 2023-04-12 MED ORDER — CYANOCOBALAMIN 1000 MCG/ML IJ SOLN
1000.0000 ug | Freq: Once | INTRAMUSCULAR | Status: AC
  Administered 2023-04-12: 1000 ug via INTRAMUSCULAR

## 2023-04-12 NOTE — Progress Notes (Signed)
Pt here for bi-weekly B12 injection per Sandford Craze, NP  B12 given IM, and pt tolerated injection well.  Next B12 injection scheduled for two weeks

## 2023-04-27 ENCOUNTER — Ambulatory Visit: Payer: 59

## 2023-04-27 DIAGNOSIS — E538 Deficiency of other specified B group vitamins: Secondary | ICD-10-CM | POA: Diagnosis not present

## 2023-04-27 MED ORDER — CYANOCOBALAMIN 1000 MCG/ML IJ SOLN
1000.0000 ug | Freq: Once | INTRAMUSCULAR | Status: AC
  Administered 2023-04-27: 1000 ug via INTRAMUSCULAR

## 2023-04-27 NOTE — Progress Notes (Signed)
 Pt here for B12 injection every 2 weeks per Melissa  B12 given L deltoid IM, and pt tolerated injection well.  Next B12 injection scheduled for 2 weeks

## 2023-04-28 ENCOUNTER — Telehealth: Payer: Self-pay | Admitting: Family

## 2023-04-28 NOTE — Telephone Encounter (Signed)
-----   Message from [facility] sent at 04/27/2023  3:44 PM EST ----- Regarding: B12 injections Hope you had a great Christmas and New Years.   Virginia Grant came in today for her every 2 weeks B12 injection, she wanted me to send back a message to see how much longer she needs to come and get those every 2 weeks. I told her I'd send a message back and either Levorn or I would let her know once you answered.   KDC

## 2023-04-28 NOTE — Telephone Encounter (Signed)
 Let's repeat a b12 level in the lab at her convenience and we can decide from there.

## 2023-05-02 NOTE — Telephone Encounter (Signed)
 Patient advised of provider's recommendation and scheduled for B12 lab to be done 05/10/2023 before getting her b12 injection.

## 2023-05-10 ENCOUNTER — Ambulatory Visit (INDEPENDENT_AMBULATORY_CARE_PROVIDER_SITE_OTHER): Payer: 59 | Admitting: Emergency Medicine

## 2023-05-10 ENCOUNTER — Other Ambulatory Visit (INDEPENDENT_AMBULATORY_CARE_PROVIDER_SITE_OTHER): Payer: 59

## 2023-05-10 DIAGNOSIS — E538 Deficiency of other specified B group vitamins: Secondary | ICD-10-CM

## 2023-05-10 MED ORDER — CYANOCOBALAMIN 1000 MCG/ML IJ SOLN
1000.0000 ug | Freq: Once | INTRAMUSCULAR | Status: AC
  Administered 2023-05-10: 1000 ug via INTRAMUSCULAR

## 2023-05-10 NOTE — Progress Notes (Signed)
 Patient here for monthly b12 injection per physicians order.  Injection given in left deltoid and patient tolerated well.

## 2023-05-11 ENCOUNTER — Encounter: Payer: Self-pay | Admitting: Family

## 2023-05-11 LAB — VITAMIN B12: Vitamin B-12: 490 pg/mL (ref 211–911)

## 2023-05-24 ENCOUNTER — Ambulatory Visit (INDEPENDENT_AMBULATORY_CARE_PROVIDER_SITE_OTHER): Payer: 59 | Admitting: Neurology

## 2023-05-24 DIAGNOSIS — E538 Deficiency of other specified B group vitamins: Secondary | ICD-10-CM

## 2023-05-24 MED ORDER — CYANOCOBALAMIN 1000 MCG/ML IJ SOLN
1000.0000 ug | Freq: Once | INTRAMUSCULAR | Status: AC
  Administered 2023-05-24: 1000 ug via INTRAMUSCULAR

## 2023-05-24 NOTE — Progress Notes (Signed)
Patient is here for a vitamin B12 injection per provider orders 05/11/2023.  "Your b12 level has improved. I don't think that it will stay up though if we move you back to the monthly injections. Let's continue every 2 weeks."   Denies gastrointestinal problems or dizziness. B12 injection to left deltoid with no apparent complications.    Next injection scheduled for 06/07/2023 at 3:45 pm

## 2023-06-07 ENCOUNTER — Ambulatory Visit: Payer: 59

## 2023-06-07 ENCOUNTER — Ambulatory Visit (INDEPENDENT_AMBULATORY_CARE_PROVIDER_SITE_OTHER): Payer: 59 | Admitting: *Deleted

## 2023-06-07 DIAGNOSIS — E538 Deficiency of other specified B group vitamins: Secondary | ICD-10-CM | POA: Diagnosis not present

## 2023-06-07 MED ORDER — CYANOCOBALAMIN 1000 MCG/ML IJ SOLN
1000.0000 ug | Freq: Once | INTRAMUSCULAR | Status: AC
  Administered 2023-06-07: 1000 ug via INTRAMUSCULAR

## 2023-06-07 NOTE — Progress Notes (Signed)
Patient is here for a vitamin B12 injection per provider orders 05/11/2023.  "Your b12 level has improved. I don't think that it will stay up though if we move you back to the monthly injections. Let's continue every 2 weeks."    Given in RD without complications.  Next appt 06/21/23 3:45

## 2023-06-21 ENCOUNTER — Ambulatory Visit: Payer: 59

## 2023-06-21 DIAGNOSIS — E538 Deficiency of other specified B group vitamins: Secondary | ICD-10-CM

## 2023-06-21 MED ORDER — CYANOCOBALAMIN 1000 MCG/ML IJ SOLN
1000.0000 ug | Freq: Once | INTRAMUSCULAR | Status: AC
  Administered 2023-06-27: 1000 ug via INTRAMUSCULAR

## 2023-06-21 NOTE — Progress Notes (Signed)
 Pt here for Bi weekly B12 injection per PCP  B12 given IM L deltoid, and pt tolerated injection well.  Next B12 injection scheduled for 07/05/2023.

## 2023-06-27 DIAGNOSIS — E538 Deficiency of other specified B group vitamins: Secondary | ICD-10-CM | POA: Diagnosis not present

## 2023-07-05 ENCOUNTER — Ambulatory Visit

## 2023-07-05 ENCOUNTER — Ambulatory Visit: Payer: 59

## 2023-07-05 DIAGNOSIS — E538 Deficiency of other specified B group vitamins: Secondary | ICD-10-CM | POA: Diagnosis not present

## 2023-07-05 MED ORDER — CYANOCOBALAMIN 1000 MCG/ML IJ SOLN
1000.0000 ug | Freq: Once | INTRAMUSCULAR | Status: AC
  Administered 2023-07-05: 1000 ug via INTRAMUSCULAR

## 2023-07-05 NOTE — Progress Notes (Signed)
 Pt here for Bi-weekly B12 injection per PCP  B12 given IM R deltoid, and pt tolerated injection well.  Next B12 injection scheduled for 07/20/2023.

## 2023-07-20 ENCOUNTER — Ambulatory Visit: Admitting: Neurology

## 2023-07-20 DIAGNOSIS — E538 Deficiency of other specified B group vitamins: Secondary | ICD-10-CM | POA: Diagnosis not present

## 2023-07-20 MED ORDER — CYANOCOBALAMIN 1000 MCG/ML IJ SOLN
1000.0000 ug | Freq: Once | INTRAMUSCULAR | Status: AC
  Administered 2023-07-20: 1000 ug via INTRAMUSCULAR

## 2023-07-20 NOTE — Progress Notes (Signed)
 Patient is here for a vitamin B12 injection per orders from Melissa:  05/11/2023 "Your b12 level has improved. I don't think that it will stay up though if we move you back to the monthly injections. Let's continue every 2 weeks." Last injection: 07/08/2023. Denies gastrointestinal problems or dizziness.  B12 injection to left deltoid with no apparent complications.  Scheduled next injection in two weeks: 08/03/2023.

## 2023-08-01 IMAGING — MG MM DIGITAL SCREENING BILAT W/ TOMO AND CAD
8 series · 8 of 24 positions shown · non-contrast
Comparison: Previous exam(s).

CLINICAL DATA: Screening.

EXAM:
DIGITAL SCREENING BILATERAL MAMMOGRAM WITH TOMOSYNTHESIS AND CAD
TECHNIQUE: Bilateral screening digital craniocaudal and mediolateral oblique
mammograms were obtained. Bilateral screening digital breast
tomosynthesis was performed. The images were evaluated with
computer-aided detection.

[L CC synth-2D]
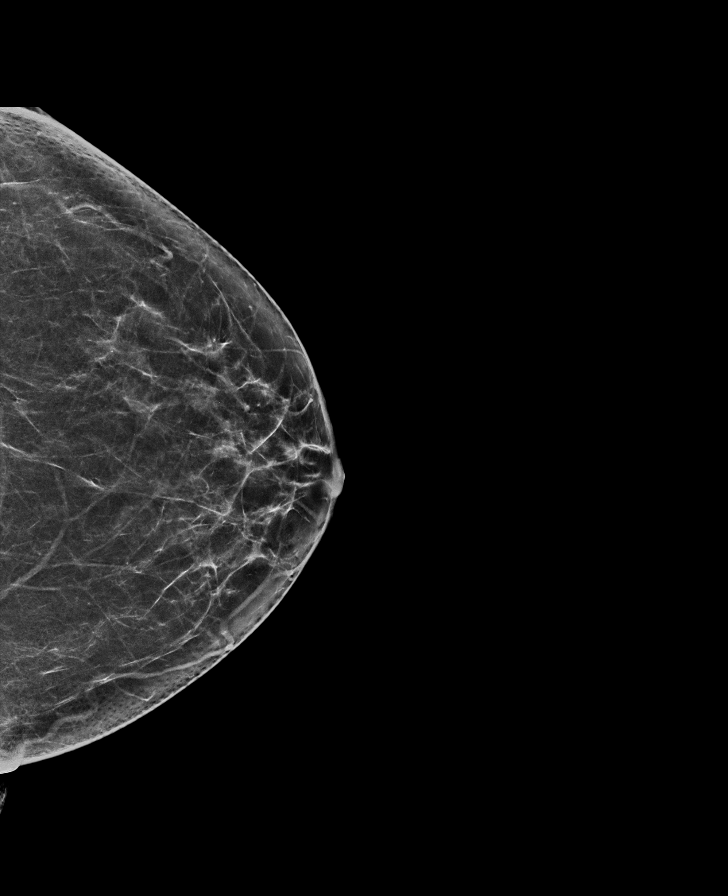

[L MLO synth-2D]
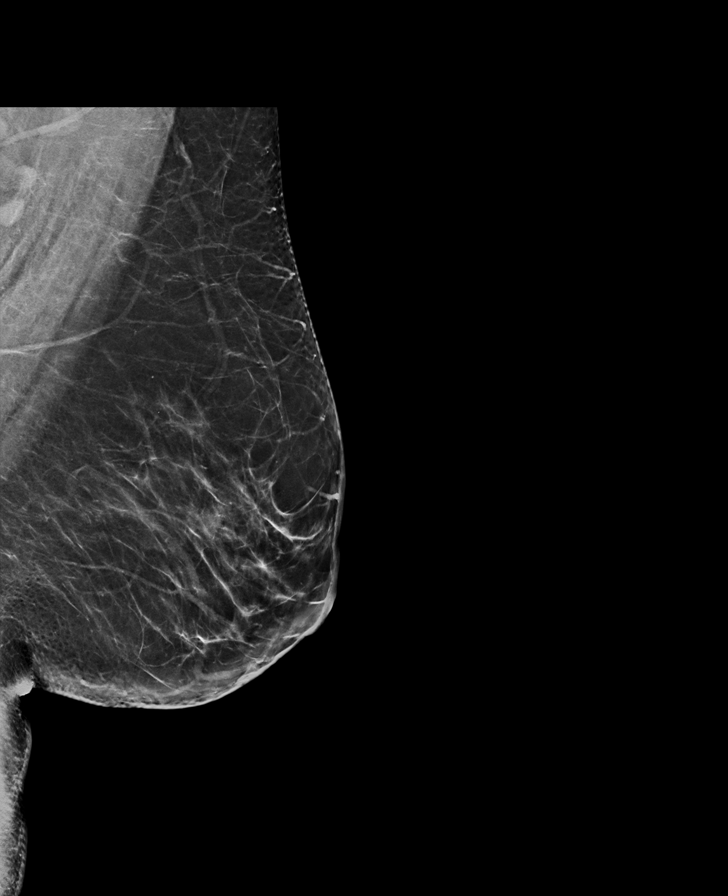

[R CC synth-2D]
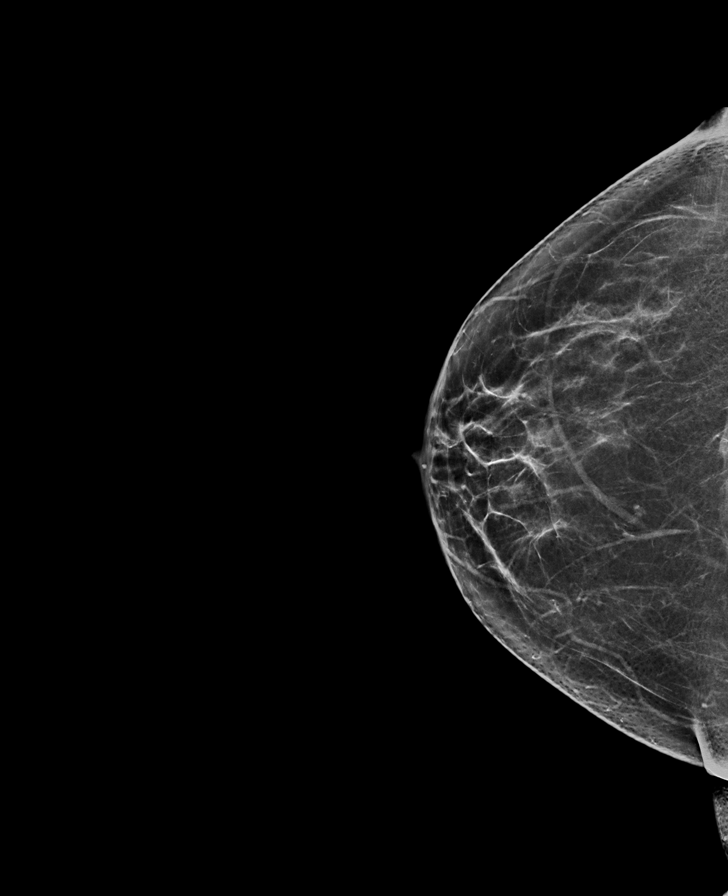

[R MLO synth-2D]
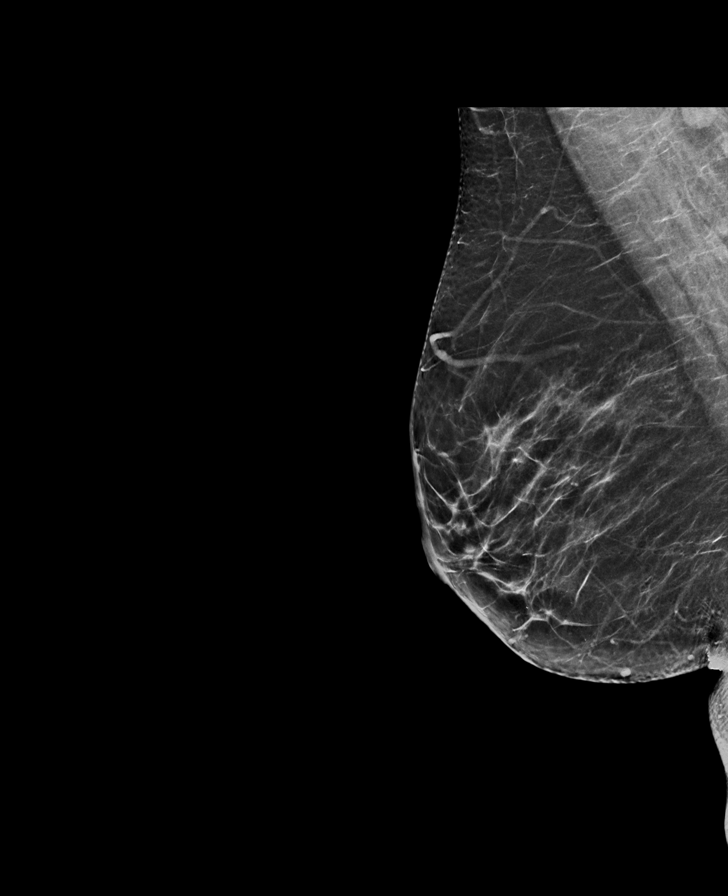

[L MLO tomo · tomo slice 38/75.0]
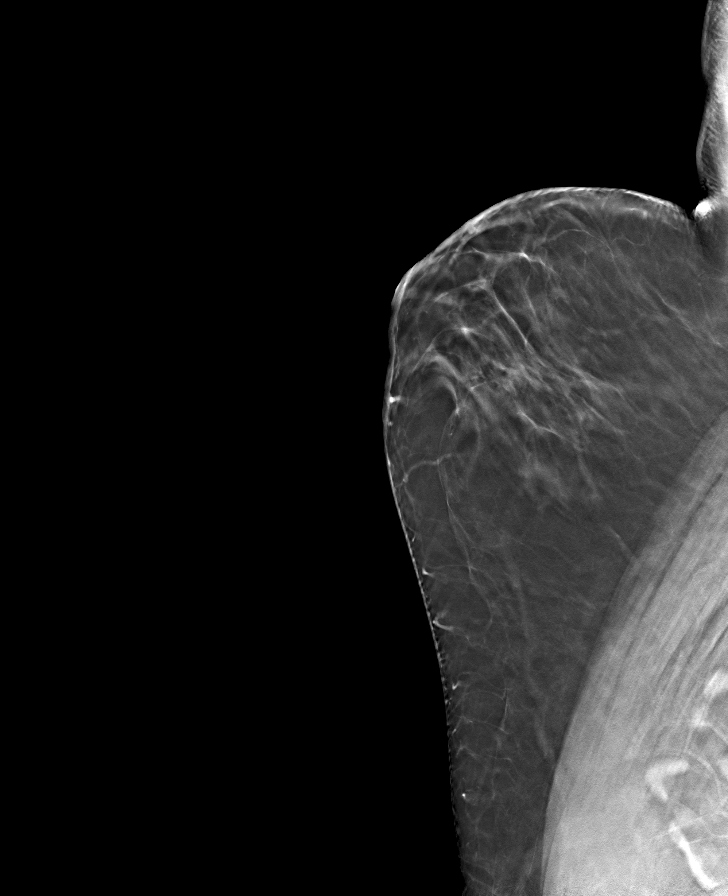

[R CC tomo · tomo slice 33/65.0]
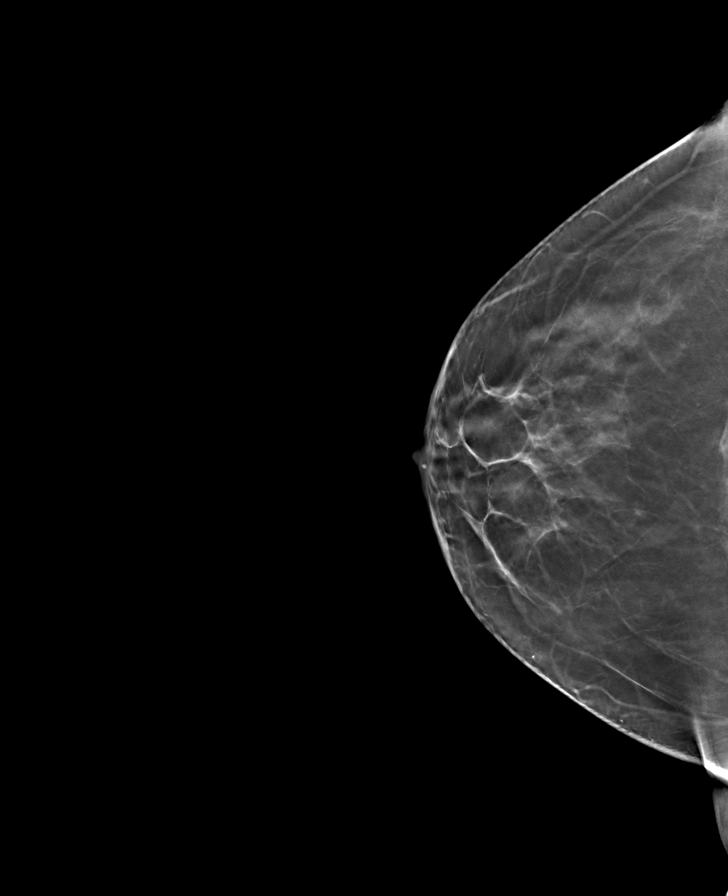

[L CC tomo · tomo slice 33/65.0]
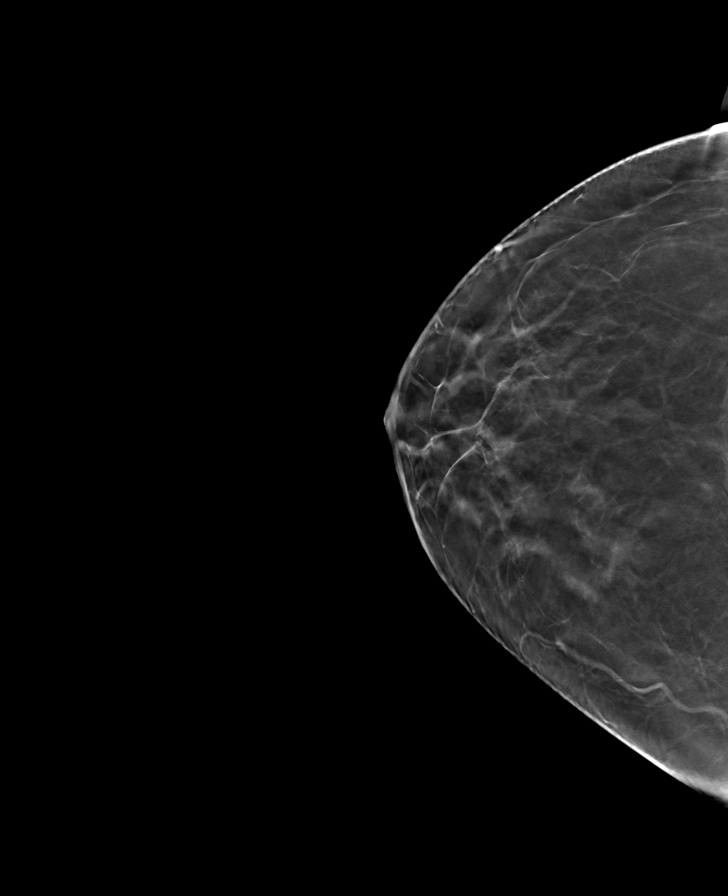

[R MLO tomo · tomo slice 35/69.0]
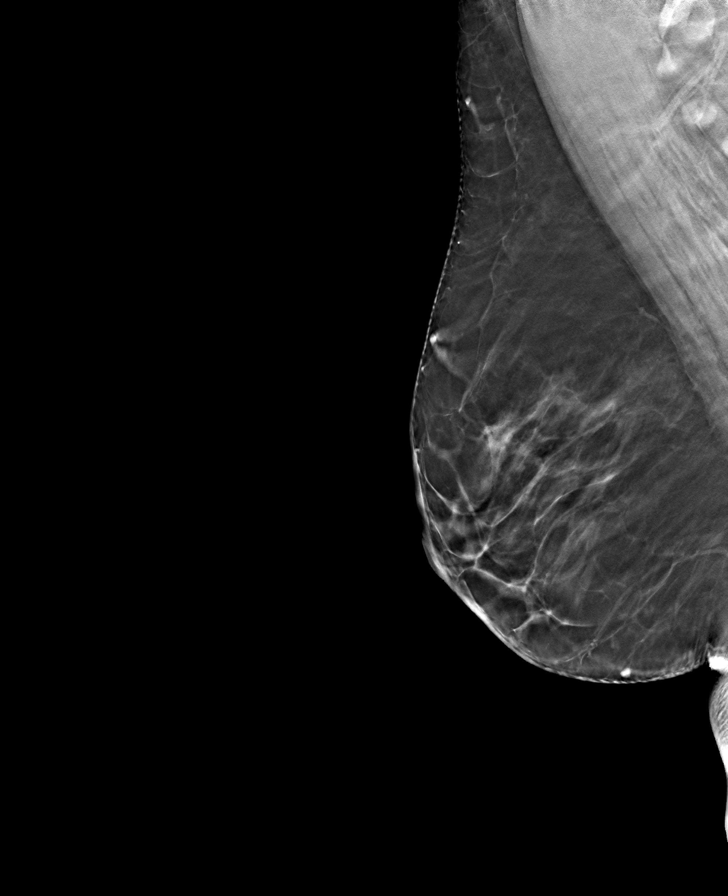

[8 of 24 positions shown; findings below may reference images not displayed]

ACR Breast Density Category b: There are scattered areas of
fibroglandular density.
FINDINGS: There are no findings suspicious for malignancy.
IMPRESSION: No mammographic evidence of malignancy. A result letter of this
screening mammogram will be mailed directly to the patient.

RECOMMENDATION:
Screening mammogram in one year. (Code:51-O-LD2)

BI-RADS CATEGORY  1: Negative.

## 2023-08-03 ENCOUNTER — Ambulatory Visit: Admitting: *Deleted

## 2023-08-03 DIAGNOSIS — E538 Deficiency of other specified B group vitamins: Secondary | ICD-10-CM | POA: Diagnosis not present

## 2023-08-03 MED ORDER — CYANOCOBALAMIN 1000 MCG/ML IJ SOLN
1000.0000 ug | Freq: Once | INTRAMUSCULAR | Status: AC
  Administered 2023-08-03: 1000 ug via INTRAMUSCULAR

## 2023-08-03 NOTE — Progress Notes (Signed)
 Patient here for bi-weekly b12 injection per physicians order.  Injection given in right deltoid and patient tolerated well.

## 2023-08-17 ENCOUNTER — Ambulatory Visit (INDEPENDENT_AMBULATORY_CARE_PROVIDER_SITE_OTHER)

## 2023-08-17 DIAGNOSIS — E538 Deficiency of other specified B group vitamins: Secondary | ICD-10-CM

## 2023-08-17 MED ORDER — CYANOCOBALAMIN 1000 MCG/ML IJ SOLN
1000.0000 ug | Freq: Once | INTRAMUSCULAR | Status: AC
  Administered 2023-08-17: 1000 ug via INTRAMUSCULAR

## 2023-08-17 NOTE — Progress Notes (Signed)
 Pt here for biweekly B12 injection per Melissa O'Sullivan,NP  B12 1000mcg given IM, and pt tolerated injection well.  Next B12 injection scheduled for 08/31/2023 @3 :30 PM

## 2023-08-31 ENCOUNTER — Ambulatory Visit

## 2023-08-31 DIAGNOSIS — E538 Deficiency of other specified B group vitamins: Secondary | ICD-10-CM

## 2023-08-31 MED ORDER — CYANOCOBALAMIN 1000 MCG/ML IJ SOLN
1000.0000 ug | Freq: Once | INTRAMUSCULAR | Status: AC
Start: 2023-08-31 — End: 2023-08-31
  Administered 2023-08-31: 1000 ug via INTRAMUSCULAR

## 2023-08-31 NOTE — Progress Notes (Signed)
 Pt here for every 2 weeks B12 injection per Melissa.  B12 1000mcg given L deltoid IM, and pt tolerated injection well.  Next B12 injection scheduled for 2 weeks.

## 2023-09-14 ENCOUNTER — Ambulatory Visit (INDEPENDENT_AMBULATORY_CARE_PROVIDER_SITE_OTHER): Admitting: Neurology

## 2023-09-14 DIAGNOSIS — E538 Deficiency of other specified B group vitamins: Secondary | ICD-10-CM

## 2023-09-14 MED ORDER — CYANOCOBALAMIN 1000 MCG/ML IJ SOLN
1000.0000 ug | Freq: Once | INTRAMUSCULAR | Status: AC
  Administered 2023-09-14: 1000 ug via INTRAMUSCULAR

## 2023-09-14 NOTE — Progress Notes (Signed)
 Patient is here for a vitamin B12 injection per orders from Melissa:  05/11/2023 "Your b12 level has improved. I don't think that it will stay up though if we move you back to the monthly injections. Let's continue every 2 weeks." Last injection: 08/31/2023. Denies gastrointestinal problems or dizziness.   B12 injection to right deltoid with no apparent complications.  Scheduled next injection in two weeks: 09/28/2023.

## 2023-09-28 ENCOUNTER — Ambulatory Visit (INDEPENDENT_AMBULATORY_CARE_PROVIDER_SITE_OTHER)

## 2023-09-28 DIAGNOSIS — E538 Deficiency of other specified B group vitamins: Secondary | ICD-10-CM

## 2023-09-28 MED ORDER — CYANOCOBALAMIN 1000 MCG/ML IJ SOLN
1000.0000 ug | Freq: Once | INTRAMUSCULAR | Status: AC
  Administered 2023-09-28: 1000 ug via INTRAMUSCULAR

## 2023-09-28 NOTE — Progress Notes (Signed)
 Pt here for monthly B12 injection per original order dated: 05/11/23  Last B12 injection: 09/14/23  Last B12 level:  05/11/23  B12 given L deltoid IM, and pt tolerated injection well.  Next B12 injection scheduled for: 2 weeks

## 2023-10-12 ENCOUNTER — Ambulatory Visit (INDEPENDENT_AMBULATORY_CARE_PROVIDER_SITE_OTHER)

## 2023-10-12 DIAGNOSIS — E538 Deficiency of other specified B group vitamins: Secondary | ICD-10-CM

## 2023-10-12 MED ORDER — CYANOCOBALAMIN 1000 MCG/ML IJ SOLN
1000.0000 ug | Freq: Once | INTRAMUSCULAR | Status: AC
  Administered 2023-10-12: 1000 ug via INTRAMUSCULAR

## 2023-10-12 NOTE — Progress Notes (Signed)
 Pt here for  B12 injection per original order dated: 05/11/23 to continue B12 every 2 weeks.   Last B12 injection:09/28/23  Last B12 level:  05/10/23 was 490  B12 1000mcg given IM, and pt tolerated injection well.  Next B12 injection scheduled for: 10/26/23

## 2023-10-26 ENCOUNTER — Ambulatory Visit

## 2023-10-26 DIAGNOSIS — E538 Deficiency of other specified B group vitamins: Secondary | ICD-10-CM | POA: Diagnosis not present

## 2023-10-26 MED ORDER — CYANOCOBALAMIN 1000 MCG/ML IJ SOLN
1000.0000 ug | Freq: Once | INTRAMUSCULAR | Status: AC
  Administered 2023-10-26: 1000 ug via INTRAMUSCULAR

## 2023-10-26 NOTE — Progress Notes (Signed)
 Pt here for monthly B12 injection per original order dated: 05/10/2023  Last B12 injection:10/12/2023  Last B12 level: 05/10/2023   B12 1000mcg given IM, and pt tolerated injection well.  Next B12 injection scheduled for:

## 2023-11-09 ENCOUNTER — Ambulatory Visit (INDEPENDENT_AMBULATORY_CARE_PROVIDER_SITE_OTHER)

## 2023-11-09 DIAGNOSIS — E538 Deficiency of other specified B group vitamins: Secondary | ICD-10-CM | POA: Diagnosis not present

## 2023-11-09 MED ORDER — CYANOCOBALAMIN 1000 MCG/ML IJ SOLN
1000.0000 ug | Freq: Once | INTRAMUSCULAR | Status: AC
  Administered 2023-11-09: 1000 ug via INTRAMUSCULAR

## 2023-11-09 NOTE — Progress Notes (Signed)
 Pt here for B12 injection per original order dated: 05/11/23 to continue B12 every 2 weeks.   Last B12 injection: 10/26/23  Last B12 level:  05/10/23 was 490  B12 given IM, and pt tolerated injection well.  Next B12 injection scheduled for: 11/23/23.

## 2023-11-23 ENCOUNTER — Telehealth: Payer: Self-pay

## 2023-11-23 ENCOUNTER — Ambulatory Visit (INDEPENDENT_AMBULATORY_CARE_PROVIDER_SITE_OTHER)

## 2023-11-23 DIAGNOSIS — E538 Deficiency of other specified B group vitamins: Secondary | ICD-10-CM | POA: Diagnosis not present

## 2023-11-23 MED ORDER — CYANOCOBALAMIN 1000 MCG/ML IJ SOLN: 1000.0000 ug | INTRAMUSCULAR | 1 refills | Status: DC

## 2023-11-23 MED ORDER — CYANOCOBALAMIN 1000 MCG/ML IJ SOLN
1000.0000 ug | Freq: Once | INTRAMUSCULAR | Status: AC
  Administered 2023-11-23: 1000 ug via INTRAMUSCULAR

## 2023-11-23 MED ORDER — SYRINGE/NEEDLE (DISP) 25G X 1" 5 ML MISC: 0 refills | Status: AC

## 2023-11-23 NOTE — Telephone Encounter (Signed)
 Patient notified of provider's comments and advised of medication sent to the pharmacy. She reports she knows how to self inject IM and she will continue to do at home every 2 weeks.

## 2023-11-23 NOTE — Progress Notes (Signed)
 Pt here for monthly B12 injection per Melissa  Last B12 injection: 11/09/2023  Last B12 level:   05/10/23  B12 1000mcg given IM, and pt tolerated injection well.  Next B12 injection scheduled for: Pt would like to switch to OTC B12. Message sent to PCP

## 2023-11-23 NOTE — Telephone Encounter (Signed)
 I don't think the OTC would be strong enough for her.  I would recommend that we train her to self administer. I have sent rx to her pharmacy.

## 2023-11-23 NOTE — Telephone Encounter (Signed)
 Pt came in for B12 shot and advised that it is hard for her to come in every two weeks. Pt advised she would like to switch to OTC. Please advise

## 2023-11-23 NOTE — Addendum Note (Signed)
 Addended by: DARYL SETTER on: 11/23/2023 11:34 AM   Modules accepted: Orders

## 2024-05-07 ENCOUNTER — Other Ambulatory Visit: Payer: Self-pay | Admitting: Family

## 2024-05-07 DIAGNOSIS — E538 Deficiency of other specified B group vitamins: Secondary | ICD-10-CM
